# Patient Record
Sex: Male | Born: 1960 | Race: Black or African American | Hispanic: No | Marital: Single | Smoking: Current every day smoker
Health system: Southern US, Community
[De-identification: ages and names within clinical notes are randomized; demographics above are authoritative.]

## PROBLEM LIST (undated history)

## (undated) DIAGNOSIS — G629 Polyneuropathy, unspecified: Secondary | ICD-10-CM

## (undated) DIAGNOSIS — Z21 Asymptomatic human immunodeficiency virus [HIV] infection status: Secondary | ICD-10-CM

## (undated) DIAGNOSIS — B1910 Unspecified viral hepatitis B without hepatic coma: Secondary | ICD-10-CM

## (undated) DIAGNOSIS — E785 Hyperlipidemia, unspecified: Secondary | ICD-10-CM

## (undated) DIAGNOSIS — B009 Herpesviral infection, unspecified: Secondary | ICD-10-CM

## (undated) DIAGNOSIS — B20 Human immunodeficiency virus [HIV] disease: Secondary | ICD-10-CM

## (undated) DIAGNOSIS — J449 Chronic obstructive pulmonary disease, unspecified: Secondary | ICD-10-CM

## (undated) DIAGNOSIS — I10 Essential (primary) hypertension: Secondary | ICD-10-CM

## (undated) HISTORY — PX: EYE SURGERY: SHX253

## (undated) HISTORY — DX: Chronic obstructive pulmonary disease, unspecified: J44.9

## (undated) HISTORY — PX: HERNIA REPAIR: SHX51

---

## 1997-11-01 ENCOUNTER — Emergency Department (HOSPITAL_COMMUNITY): Admission: EM | Admit: 1997-11-01 | Discharge: 1997-11-01 | Payer: Self-pay | Admitting: Emergency Medicine

## 1998-07-05 ENCOUNTER — Encounter: Payer: Self-pay | Admitting: Internal Medicine

## 1999-05-08 ENCOUNTER — Encounter: Payer: Self-pay | Admitting: Emergency Medicine

## 1999-05-08 ENCOUNTER — Emergency Department (HOSPITAL_COMMUNITY): Admission: EM | Admit: 1999-05-08 | Discharge: 1999-05-08 | Payer: Self-pay

## 1999-07-25 ENCOUNTER — Encounter: Payer: Self-pay | Admitting: Emergency Medicine

## 1999-07-25 ENCOUNTER — Emergency Department (HOSPITAL_COMMUNITY): Admission: EM | Admit: 1999-07-25 | Discharge: 1999-07-25 | Payer: Self-pay | Admitting: Emergency Medicine

## 2006-05-27 ENCOUNTER — Ambulatory Visit: Payer: Self-pay | Admitting: Family Medicine

## 2006-06-27 ENCOUNTER — Ambulatory Visit: Payer: Self-pay | Admitting: Internal Medicine

## 2006-07-25 ENCOUNTER — Ambulatory Visit: Payer: Self-pay | Admitting: Internal Medicine

## 2006-08-01 ENCOUNTER — Encounter: Payer: Self-pay | Admitting: Internal Medicine

## 2006-08-01 LAB — CONVERTED CEMR LAB

## 2006-08-19 ENCOUNTER — Ambulatory Visit: Payer: Self-pay | Admitting: Internal Medicine

## 2006-08-26 DIAGNOSIS — G609 Hereditary and idiopathic neuropathy, unspecified: Secondary | ICD-10-CM | POA: Insufficient documentation

## 2006-08-26 DIAGNOSIS — F411 Generalized anxiety disorder: Secondary | ICD-10-CM | POA: Insufficient documentation

## 2006-08-26 DIAGNOSIS — E785 Hyperlipidemia, unspecified: Secondary | ICD-10-CM

## 2006-08-26 DIAGNOSIS — I10 Essential (primary) hypertension: Secondary | ICD-10-CM | POA: Insufficient documentation

## 2006-08-26 DIAGNOSIS — Z9189 Other specified personal risk factors, not elsewhere classified: Secondary | ICD-10-CM

## 2006-08-26 DIAGNOSIS — F319 Bipolar disorder, unspecified: Secondary | ICD-10-CM

## 2006-08-26 DIAGNOSIS — B20 Human immunodeficiency virus [HIV] disease: Secondary | ICD-10-CM

## 2006-11-28 ENCOUNTER — Ambulatory Visit: Payer: Self-pay | Admitting: Internal Medicine

## 2006-11-28 LAB — CONVERTED CEMR LAB
ALT: 32 units/L (ref 0–53)
AST: 22 units/L (ref 0–37)
Absolute CD4: 293 #/uL — ABNORMAL LOW (ref 381–1469)
Albumin: 3.6 g/dL (ref 3.5–5.2)
Alkaline Phosphatase: 58 units/L (ref 39–117)
BUN: 14 mg/dL (ref 6–23)
Basophils Absolute: 0 10*3/uL (ref 0.0–0.1)
Basophils Relative: 0 % (ref 0–1)
CD4 T Helper %: 16 % — ABNORMAL LOW (ref 32–62)
CO2: 26 meq/L (ref 19–32)
Calcium: 8.7 mg/dL (ref 8.4–10.5)
Chloride: 109 meq/L (ref 96–112)
Creatinine, Ser: 0.58 mg/dL (ref 0.40–1.50)
Eosinophils Absolute: 0 10*3/uL — ABNORMAL LOW (ref 0.2–0.7)
Eosinophils Relative: 1 % (ref 0–5)
Glucose, Bld: 99 mg/dL (ref 70–99)
HCT: 34.4 % — ABNORMAL LOW (ref 39.0–52.0)
HIV 1 RNA Quant: 3640 copies/mL — ABNORMAL HIGH (ref <50)
HIV-1 RNA Quant, Log: 3.56 — ABNORMAL HIGH (ref <1.70)
Hemoglobin: 11.2 g/dL — ABNORMAL LOW (ref 13.0–17.0)
Lymphocytes Relative: 39 % (ref 12–46)
Lymphs Abs: 1.8 10*3/uL (ref 0.7–4.0)
MCHC: 32.6 g/dL (ref 30.0–36.0)
MCV: 94 fL (ref 78.0–100.0)
Monocytes Absolute: 0.5 10*3/uL (ref 0.1–1.0)
Monocytes Relative: 11 % (ref 3–12)
Neutro Abs: 2.3 10*3/uL (ref 1.7–7.7)
Neutrophils Relative %: 49 % (ref 43–77)
Platelets: 204 10*3/uL (ref 150–400)
Potassium: 3.9 meq/L (ref 3.5–5.3)
RBC: 3.66 M/uL — ABNORMAL LOW (ref 4.22–5.81)
RDW: 14.7 % (ref 11.5–15.5)
Sodium: 140 meq/L (ref 135–145)
Total Bilirubin: 0.2 mg/dL — ABNORMAL LOW (ref 0.3–1.2)
Total Lymphocytes %: 39 % (ref 12–46)
Total Protein: 6.6 g/dL (ref 6.0–8.3)
Total lymphocyte count: 1833 cells/mcL (ref 700–3300)
WBC, lymph enumeration: 4.7 10*3/uL (ref 4.0–10.5)
WBC: 4.7 10*3/uL (ref 4.0–10.5)

## 2007-02-20 ENCOUNTER — Ambulatory Visit: Payer: Self-pay | Admitting: Internal Medicine

## 2007-02-20 LAB — CONVERTED CEMR LAB
ALT: 28 units/L (ref 0–53)
AST: 23 units/L (ref 0–37)
Absolute CD4: 179 #/uL — ABNORMAL LOW (ref 381–1469)
Albumin: 4.3 g/dL (ref 3.5–5.2)
Alkaline Phosphatase: 77 units/L (ref 39–117)
BUN: 17 mg/dL (ref 6–23)
Basophils Absolute: 0 10*3/uL (ref 0.0–0.1)
Basophils Relative: 0 % (ref 0–1)
CD4 T Helper %: 13 % — ABNORMAL LOW (ref 32–62)
CO2: 23 meq/L (ref 19–32)
Calcium: 9.1 mg/dL (ref 8.4–10.5)
Chloride: 101 meq/L (ref 96–112)
Creatinine, Ser: 0.75 mg/dL (ref 0.40–1.50)
Eosinophils Absolute: 0 10*3/uL (ref 0.0–0.7)
Eosinophils Relative: 0 % (ref 0–5)
Glucose, Bld: 75 mg/dL (ref 70–99)
HCT: 44.8 % (ref 39.0–52.0)
HIV 1 RNA Quant: 11600 copies/mL — ABNORMAL HIGH (ref <50)
HIV-1 RNA Quant, Log: 4.06 — ABNORMAL HIGH (ref <1.70)
Hemoglobin: 14.3 g/dL (ref 13.0–17.0)
Lymphocytes Relative: 26 % (ref 12–46)
Lymphs Abs: 1.4 10*3/uL (ref 0.7–4.0)
MCHC: 31.9 g/dL (ref 30.0–36.0)
MCV: 89.2 fL (ref 78.0–100.0)
Monocytes Absolute: 0.5 10*3/uL (ref 0.1–1.0)
Monocytes Relative: 9 % (ref 3–12)
Neutro Abs: 3.4 10*3/uL (ref 1.7–7.7)
Neutrophils Relative %: 65 % (ref 43–77)
Platelets: 187 10*3/uL (ref 150–400)
Potassium: 4.4 meq/L (ref 3.5–5.3)
RBC: 5.02 M/uL (ref 4.22–5.81)
RDW: 15.1 % (ref 11.5–15.5)
Sodium: 142 meq/L (ref 135–145)
Total Bilirubin: 0.5 mg/dL (ref 0.3–1.2)
Total Lymphocytes %: 26 % (ref 12–46)
Total Protein: 8.4 g/dL — ABNORMAL HIGH (ref 6.0–8.3)
Total lymphocyte count: 1378 cells/mcL (ref 700–3300)
WBC, lymph enumeration: 5.3 10*3/uL (ref 4.0–10.5)
WBC: 5.3 10*3/uL (ref 4.0–10.5)

## 2007-02-27 ENCOUNTER — Encounter: Payer: Self-pay | Admitting: Internal Medicine

## 2007-02-27 LAB — CONVERTED CEMR LAB

## 2007-05-26 ENCOUNTER — Ambulatory Visit: Payer: Self-pay | Admitting: Internal Medicine

## 2007-05-26 LAB — CONVERTED CEMR LAB
AST: 21 units/L (ref 0–37)
Absolute CD4: 168 #/uL — ABNORMAL LOW (ref 381–1469)
Albumin: 4 g/dL (ref 3.5–5.2)
Alkaline Phosphatase: 64 units/L (ref 39–117)
BUN: 14 mg/dL (ref 6–23)
Basophils Relative: 0 % (ref 0–1)
Creatinine, Ser: 0.75 mg/dL (ref 0.40–1.50)
Eosinophils Absolute: 0 10*3/uL (ref 0.0–0.7)
Eosinophils Relative: 0 % (ref 0–5)
Glucose, Bld: 79 mg/dL (ref 70–99)
HCT: 42.4 % (ref 39.0–52.0)
HDL: 50 mg/dL (ref >39)
HIV 1 RNA Quant: 16500 copies/mL — ABNORMAL HIGH (ref <50)
LDL Cholesterol: 120 mg/dL — ABNORMAL HIGH (ref 0–99)
Lymphs Abs: 1.5 10*3/uL (ref 0.7–4.0)
MCHC: 31.8 g/dL (ref 30.0–36.0)
MCV: 88.7 fL (ref 78.0–100.0)
Monocytes Absolute: 0.4 10*3/uL (ref 0.1–1.0)
Monocytes Relative: 9 % (ref 3–12)
Neutrophils Relative %: 56 % (ref 43–77)
Potassium: 3.7 meq/L (ref 3.5–5.3)
RBC: 4.78 M/uL (ref 4.22–5.81)
Total Bilirubin: 0.3 mg/dL (ref 0.3–1.2)
Total CHOL/HDL Ratio: 3.7
Total Lymphocytes %: 34 % (ref 12–46)
Triglycerides: 80 mg/dL (ref <150)
WBC: 4.5 10*3/uL (ref 4.0–10.5)

## 2007-08-21 ENCOUNTER — Ambulatory Visit: Payer: Self-pay | Admitting: Internal Medicine

## 2007-08-21 LAB — CONVERTED CEMR LAB
Albumin: 3.7 g/dL (ref 3.5–5.2)
BUN: 11 mg/dL (ref 6–23)
CO2: 23 meq/L (ref 19–32)
Eosinophils Relative: 0 % (ref 0–5)
Glucose, Bld: 78 mg/dL (ref 70–99)
HCT: 48.9 % (ref 39.0–52.0)
HIV 1 RNA Quant: 29500 copies/mL — ABNORMAL HIGH (ref <50)
HIV-1 RNA Quant, Log: 4.47 — ABNORMAL HIGH (ref <1.70)
Lymphocytes Relative: 26 % (ref 12–46)
Lymphs Abs: 1.1 10*3/uL (ref 0.7–4.0)
MCV: 88.4 fL (ref 78.0–100.0)
Monocytes Relative: 11 % (ref 3–12)
Neutrophils Relative %: 62 % (ref 43–77)
Platelets: 117 10*3/uL — ABNORMAL LOW (ref 150–400)
Potassium: 3.5 meq/L (ref 3.5–5.3)
RBC: 5.53 M/uL (ref 4.22–5.81)
Sodium: 142 meq/L (ref 135–145)
Total Protein: 7 g/dL (ref 6.0–8.3)
WBC: 4.3 10*3/uL (ref 4.0–10.5)

## 2007-09-04 ENCOUNTER — Ambulatory Visit: Payer: Self-pay | Admitting: Internal Medicine

## 2007-11-20 ENCOUNTER — Ambulatory Visit: Payer: Self-pay | Admitting: Internal Medicine

## 2007-11-20 LAB — CONVERTED CEMR LAB
Albumin: 2.1 g/dL — ABNORMAL LOW (ref 3.5–5.2)
Alkaline Phosphatase: 72 units/L (ref 39–117)
BUN: 17 mg/dL (ref 6–23)
CD4 T Helper %: 20 % — ABNORMAL LOW (ref 32–62)
Creatinine, Ser: 0.87 mg/dL (ref 0.40–1.50)
Eosinophils Absolute: 0 10*3/uL (ref 0.0–0.7)
Eosinophils Relative: 1 % (ref 0–5)
Glucose, Bld: 88 mg/dL (ref 70–99)
HCT: 42.9 % (ref 39.0–52.0)
HIV-1 RNA Quant, Log: 2.16 — ABNORMAL HIGH (ref <1.68)
Hemoglobin: 14.5 g/dL (ref 13.0–17.0)
Lymphs Abs: 1.3 10*3/uL (ref 0.7–4.0)
MCV: 87.9 fL (ref 78.0–100.0)
Monocytes Absolute: 0.6 10*3/uL (ref 0.1–1.0)
Monocytes Relative: 10 % (ref 3–12)
Potassium: 4.1 meq/L (ref 3.5–5.3)
RBC: 4.88 M/uL (ref 4.22–5.81)
WBC, lymph enumeration: 5.3 10*3/uL (ref 4.0–10.5)
WBC: 5.3 10*3/uL (ref 4.0–10.5)

## 2007-12-04 ENCOUNTER — Ambulatory Visit: Payer: Self-pay | Admitting: Internal Medicine

## 2007-12-04 ENCOUNTER — Encounter: Payer: Self-pay | Admitting: Internal Medicine

## 2008-02-23 ENCOUNTER — Encounter: Payer: Self-pay | Admitting: Internal Medicine

## 2008-02-23 ENCOUNTER — Ambulatory Visit: Payer: Self-pay | Admitting: Internal Medicine

## 2008-02-24 ENCOUNTER — Encounter: Payer: Self-pay | Admitting: Internal Medicine

## 2008-02-24 LAB — CONVERTED CEMR LAB
ALT: 31 units/L (ref 0–53)
AST: 57 units/L — ABNORMAL HIGH (ref 0–37)
Absolute CD4: 148 #/uL — ABNORMAL LOW (ref 381–1469)
Alkaline Phosphatase: 86 units/L (ref 39–117)
Basophils Absolute: 0 10*3/uL (ref 0.0–0.1)
Basophils Relative: 1 % (ref 0–1)
Creatinine, Ser: 0.53 mg/dL (ref 0.40–1.50)
Eosinophils Relative: 0 % (ref 0–5)
HCT: 42.2 % (ref 39.0–52.0)
Hemoglobin: 13.5 g/dL (ref 13.0–17.0)
MCHC: 32 g/dL (ref 30.0–36.0)
Monocytes Absolute: 0.3 10*3/uL (ref 0.1–1.0)
Neutro Abs: 1.9 10*3/uL (ref 1.7–7.7)
RDW: 13.9 % (ref 11.5–15.5)
Total Bilirubin: 0.3 mg/dL (ref 0.3–1.2)
Total Lymphocytes %: 28 % (ref 12–46)
Total lymphocyte count: 868 cells/mcL (ref 700–3300)

## 2008-02-25 ENCOUNTER — Telehealth: Payer: Self-pay | Admitting: Internal Medicine

## 2008-02-26 ENCOUNTER — Encounter: Payer: Self-pay | Admitting: Internal Medicine

## 2008-02-27 ENCOUNTER — Ambulatory Visit: Payer: Self-pay | Admitting: Internal Medicine

## 2008-03-04 LAB — CONVERTED CEMR LAB
Calcium: 7.9 mg/dL — ABNORMAL LOW (ref 8.4–10.5)
Sodium: 139 meq/L (ref 135–145)

## 2008-03-22 ENCOUNTER — Encounter: Payer: Self-pay | Admitting: Internal Medicine

## 2008-03-22 ENCOUNTER — Ambulatory Visit: Payer: Self-pay | Admitting: Internal Medicine

## 2008-03-22 LAB — CONVERTED CEMR LAB
ALT: 17 units/L (ref 0–53)
AST: 22 units/L (ref 0–37)
Absolute CD4: 115 #/uL — ABNORMAL LOW (ref 381–1469)
Albumin: 2.6 g/dL — ABNORMAL LOW (ref 3.5–5.2)
Alkaline Phosphatase: 70 units/L (ref 39–117)
Basophils Absolute: 0 10*3/uL (ref 0.0–0.1)
CD4 T Helper %: 9 % — ABNORMAL LOW (ref 32–62)
Glucose, Bld: 80 mg/dL (ref 70–99)
HCT: 38.4 % — ABNORMAL LOW (ref 39.0–52.0)
HIV-1 RNA Quant, Log: 4.94 — ABNORMAL HIGH (ref <1.68)
Hemoglobin: 12.4 g/dL — ABNORMAL LOW (ref 13.0–17.0)
Lymphocytes Relative: 25 % (ref 12–46)
Monocytes Absolute: 0.4 10*3/uL (ref 0.1–1.0)
Monocytes Relative: 8 % (ref 3–12)
Neutro Abs: 3.4 10*3/uL (ref 1.7–7.7)
Neutrophils Relative %: 66 % (ref 43–77)
Platelets: 289 10*3/uL (ref 150–400)
Potassium: 2.7 meq/L — CL (ref 3.5–5.3)
Sodium: 141 meq/L (ref 135–145)
Total Protein: 6.5 g/dL (ref 6.0–8.3)
WBC: 5.1 10*3/uL (ref 4.0–10.5)

## 2008-03-23 ENCOUNTER — Telehealth: Payer: Self-pay | Admitting: Internal Medicine

## 2008-04-05 ENCOUNTER — Telehealth: Payer: Self-pay | Admitting: Internal Medicine

## 2008-04-06 ENCOUNTER — Ambulatory Visit: Payer: Self-pay | Admitting: Internal Medicine

## 2008-04-06 ENCOUNTER — Encounter: Payer: Self-pay | Admitting: Internal Medicine

## 2008-04-06 ENCOUNTER — Encounter (INDEPENDENT_AMBULATORY_CARE_PROVIDER_SITE_OTHER): Payer: Self-pay | Admitting: Internal Medicine

## 2008-04-06 DIAGNOSIS — L03119 Cellulitis of unspecified part of limb: Secondary | ICD-10-CM

## 2008-04-06 DIAGNOSIS — L02619 Cutaneous abscess of unspecified foot: Secondary | ICD-10-CM

## 2008-04-06 DIAGNOSIS — L03019 Cellulitis of unspecified finger: Secondary | ICD-10-CM

## 2008-04-06 DIAGNOSIS — L02519 Cutaneous abscess of unspecified hand: Secondary | ICD-10-CM

## 2008-04-22 ENCOUNTER — Telehealth (INDEPENDENT_AMBULATORY_CARE_PROVIDER_SITE_OTHER): Payer: Self-pay | Admitting: *Deleted

## 2008-05-28 ENCOUNTER — Telehealth: Payer: Self-pay | Admitting: Internal Medicine

## 2008-06-03 ENCOUNTER — Encounter: Payer: Self-pay | Admitting: Internal Medicine

## 2008-06-03 ENCOUNTER — Ambulatory Visit: Payer: Self-pay | Admitting: Internal Medicine

## 2008-07-14 ENCOUNTER — Telehealth: Payer: Self-pay | Admitting: Internal Medicine

## 2008-07-20 ENCOUNTER — Ambulatory Visit: Payer: Self-pay | Admitting: Internal Medicine

## 2008-07-20 ENCOUNTER — Encounter: Payer: Self-pay | Admitting: Internal Medicine

## 2008-07-20 LAB — CONVERTED CEMR LAB
ALT: 13 units/L (ref 0–53)
Absolute CD4: 200 #/uL — ABNORMAL LOW (ref 381–1469)
Albumin: 1.7 g/dL — ABNORMAL LOW (ref 3.5–5.2)
CO2: 25 meq/L (ref 19–32)
Glucose, Bld: 104 mg/dL — ABNORMAL HIGH (ref 70–99)
HIV 1 RNA Quant: 1900 copies/mL — ABNORMAL HIGH (ref <48)
HIV-1 RNA Quant, Log: 3.28 — ABNORMAL HIGH (ref <1.68)
Hemoglobin: 13.8 g/dL (ref 13.0–17.0)
Lymphocytes Relative: 26 % (ref 12–46)
Lymphs Abs: 1.7 10*3/uL (ref 0.7–4.0)
Monocytes Absolute: 0.7 10*3/uL (ref 0.1–1.0)
Monocytes Relative: 11 % (ref 3–12)
Neutro Abs: 3.9 10*3/uL (ref 1.7–7.7)
Neutrophils Relative %: 60 % (ref 43–77)
Potassium: 3.9 meq/L (ref 3.5–5.3)
RBC: 4.85 M/uL (ref 4.22–5.81)
Sodium: 143 meq/L (ref 135–145)
Total Protein: 5.4 g/dL — ABNORMAL LOW (ref 6.0–8.3)
WBC: 6.4 10*3/uL (ref 4.0–10.5)

## 2008-07-29 ENCOUNTER — Encounter: Payer: Self-pay | Admitting: Internal Medicine

## 2008-07-29 ENCOUNTER — Ambulatory Visit: Payer: Self-pay | Admitting: Internal Medicine

## 2008-07-29 DIAGNOSIS — R609 Edema, unspecified: Secondary | ICD-10-CM

## 2008-08-04 ENCOUNTER — Encounter: Payer: Self-pay | Admitting: Internal Medicine

## 2008-08-05 ENCOUNTER — Ambulatory Visit: Payer: Self-pay | Admitting: Vascular Surgery

## 2008-08-05 ENCOUNTER — Ambulatory Visit (HOSPITAL_COMMUNITY): Admission: RE | Admit: 2008-08-05 | Discharge: 2008-08-05 | Payer: Self-pay | Admitting: Internal Medicine

## 2008-08-05 ENCOUNTER — Encounter: Payer: Self-pay | Admitting: Internal Medicine

## 2008-08-05 LAB — CONVERTED CEMR LAB
Collection Interval-CRCL: 24 hr
Creatinine 24 HR UR: 1570 mg/24hr (ref 800–2000)
Creatinine Clearance: 218 mL/min — ABNORMAL HIGH (ref 75–125)
Protein, Ur: 2873 mg/24hr — ABNORMAL HIGH (ref 50–100)

## 2008-08-19 ENCOUNTER — Encounter: Payer: Self-pay | Admitting: Internal Medicine

## 2008-08-19 ENCOUNTER — Ambulatory Visit: Payer: Self-pay | Admitting: Internal Medicine

## 2008-08-23 ENCOUNTER — Ambulatory Visit (HOSPITAL_COMMUNITY): Admission: RE | Admit: 2008-08-23 | Discharge: 2008-08-23 | Payer: Self-pay | Admitting: Internal Medicine

## 2008-08-26 ENCOUNTER — Telehealth: Payer: Self-pay | Admitting: Internal Medicine

## 2008-09-07 ENCOUNTER — Encounter: Payer: Self-pay | Admitting: Internal Medicine

## 2008-09-07 ENCOUNTER — Ambulatory Visit (HOSPITAL_COMMUNITY): Admission: RE | Admit: 2008-09-07 | Discharge: 2008-09-07 | Payer: Self-pay | Admitting: Internal Medicine

## 2008-09-30 ENCOUNTER — Telehealth: Payer: Self-pay | Admitting: Internal Medicine

## 2008-10-05 ENCOUNTER — Encounter: Payer: Self-pay | Admitting: Internal Medicine

## 2008-10-06 ENCOUNTER — Encounter: Payer: Self-pay | Admitting: Internal Medicine

## 2008-10-21 ENCOUNTER — Encounter: Payer: Self-pay | Admitting: Internal Medicine

## 2008-11-08 ENCOUNTER — Telehealth: Payer: Self-pay | Admitting: Internal Medicine

## 2008-12-30 ENCOUNTER — Encounter: Payer: Self-pay | Admitting: Internal Medicine

## 2009-01-13 ENCOUNTER — Telehealth: Payer: Self-pay | Admitting: Internal Medicine

## 2010-04-24 ENCOUNTER — Encounter: Payer: Self-pay | Admitting: Infectious Diseases

## 2010-05-05 ENCOUNTER — Ambulatory Visit: Payer: Self-pay | Admitting: Adult Health

## 2010-10-03 ENCOUNTER — Ambulatory Visit: Payer: Self-pay

## 2010-12-01 NOTE — Progress Notes (Signed)
°  This encounter was created in error - please disregard.  Encounter edited with Jennet Maduro. MBP

## 2011-05-15 ENCOUNTER — Ambulatory Visit: Payer: Medicare Other

## 2011-05-15 ENCOUNTER — Other Ambulatory Visit (HOSPITAL_COMMUNITY)
Admission: RE | Admit: 2011-05-15 | Discharge: 2011-05-15 | Disposition: A | Discharge status: Home / Self Care | Payer: Medicare Other | Source: Ambulatory Visit | Attending: Infectious Diseases | Admitting: Infectious Diseases

## 2011-05-15 DIAGNOSIS — B20 Human immunodeficiency virus [HIV] disease: Secondary | ICD-10-CM

## 2011-05-15 DIAGNOSIS — G4700 Insomnia, unspecified: Secondary | ICD-10-CM | POA: Insufficient documentation

## 2011-05-15 DIAGNOSIS — R748 Abnormal levels of other serum enzymes: Secondary | ICD-10-CM

## 2011-05-15 DIAGNOSIS — F319 Bipolar disorder, unspecified: Secondary | ICD-10-CM | POA: Insufficient documentation

## 2011-05-15 DIAGNOSIS — B1910 Unspecified viral hepatitis B without hepatic coma: Secondary | ICD-10-CM

## 2011-05-15 DIAGNOSIS — Z113 Encounter for screening for infections with a predominantly sexual mode of transmission: Secondary | ICD-10-CM | POA: Insufficient documentation

## 2011-05-15 DIAGNOSIS — G8929 Other chronic pain: Secondary | ICD-10-CM

## 2011-05-15 DIAGNOSIS — I10 Essential (primary) hypertension: Secondary | ICD-10-CM

## 2011-05-15 DIAGNOSIS — Z8619 Personal history of other infectious and parasitic diseases: Secondary | ICD-10-CM | POA: Insufficient documentation

## 2011-05-15 LAB — COMPLETE METABOLIC PANEL WITH GFR
ALT: 13 U/L (ref 0–53)
AST: 16 U/L (ref 0–37)
Albumin: 3.9 g/dL (ref 3.5–5.2)
CO2: 25 mEq/L (ref 19–32)
Calcium: 9.2 mg/dL (ref 8.4–10.5)
Chloride: 104 mEq/L (ref 96–112)
Creat: 0.64 mg/dL (ref 0.50–1.35)
GFR, Est African American: >89 mL/min
Potassium: 3.9 mEq/L (ref 3.5–5.3)

## 2011-05-15 LAB — URINALYSIS
Bilirubin Urine: NEGATIVE
Glucose, UA: NEGATIVE mg/dL
Hgb urine dipstick: NEGATIVE
Ketones, ur: NEGATIVE mg/dL
Leukocytes, UA: NEGATIVE
Nitrite: NEGATIVE
Protein, ur: >300 mg/dL — AB
Specific Gravity, Urine: 1.025 (ref 1.005–1.030)
Urobilinogen, UA: 1 mg/dL (ref 0.0–1.0)
pH: 6 (ref 5.0–8.0)

## 2011-05-15 LAB — CBC WITH DIFFERENTIAL/PLATELET
Eosinophils Relative: 1 % (ref 0–5)
HCT: 43.4 % (ref 39.0–52.0)
Lymphocytes Relative: 28 % (ref 12–46)
Lymphs Abs: 1.6 10*3/uL (ref 0.7–4.0)
MCV: 84.9 fL (ref 78.0–100.0)
Monocytes Absolute: 0.5 10*3/uL (ref 0.1–1.0)
Neutro Abs: 3.5 10*3/uL (ref 1.7–7.7)
Platelets: 246 10*3/uL (ref 150–400)
RBC: 5.11 MIL/uL (ref 4.22–5.81)
WBC: 5.6 10*3/uL (ref 4.0–10.5)

## 2011-05-15 LAB — HEPATITIS C ANTIBODY: HCV Ab: NEGATIVE

## 2011-05-15 LAB — LIPID PANEL
Cholesterol: 271 mg/dL — ABNORMAL HIGH (ref 0–200)
HDL: 50 mg/dL
LDL Cholesterol: 204 mg/dL — ABNORMAL HIGH (ref 0–99)
Total CHOL/HDL Ratio: 5.4 ratio
Triglycerides: 85 mg/dL
VLDL: 17 mg/dL (ref 0–40)

## 2011-05-15 LAB — HEPATITIS B SURFACE ANTIGEN: Hepatitis B Surface Ag: NEGATIVE

## 2011-05-16 LAB — T-HELPER CELL (CD4) - (RCID CLINIC ONLY): CD4 % Helper T Cell: 29 % — ABNORMAL LOW (ref 33–55)

## 2011-05-16 LAB — HEPATITIS A ANTIBODY, TOTAL: Hep A Total Ab: NEGATIVE

## 2011-05-16 NOTE — Progress Notes (Signed)
Medical records received from [facility] dates of treatment 1998-2000. No records from [facility].  Pt states he is adherent with his ART.   Pt [facility]ed from prison on 04-05-11.  Laurell Josephs, RN IV, BSN

## 2011-05-17 LAB — HIV-1 RNA ULTRAQUANT REFLEX TO GENTYP+: HIV 1 RNA Quant: <20 copies/mL (ref <20)

## 2011-05-18 ENCOUNTER — Other Ambulatory Visit: Payer: Self-pay | Admitting: Infectious Diseases

## 2011-05-18 ENCOUNTER — Telehealth: Payer: Self-pay | Admitting: *Deleted

## 2011-05-18 NOTE — Telephone Encounter (Signed)
States the skin remained flat, not red or hard or elevated from skin.

## 2011-05-29 ENCOUNTER — Ambulatory Visit: Payer: Medicare Other | Admitting: Internal Medicine

## 2011-06-27 ENCOUNTER — Telehealth: Payer: Self-pay | Admitting: *Deleted

## 2011-06-27 NOTE — Telephone Encounter (Signed)
Called patient to remind him of appt tomorrow and his phone is not accepting calls. Evan Bentley

## 2011-06-28 ENCOUNTER — Telehealth: Payer: Self-pay | Admitting: *Deleted

## 2011-06-28 ENCOUNTER — Ambulatory Visit: Payer: Medicare Other | Admitting: Internal Medicine

## 2011-06-28 NOTE — Telephone Encounter (Signed)
Message left sharing that he should call for a new appt and that if he needs to cancel or reschedule to please call [facility] to free up his appt time.  Will make [facility] referral to reach out to the pt.

## 2012-01-24 ENCOUNTER — Encounter (HOSPITAL_COMMUNITY): Payer: Self-pay | Admitting: Emergency Medicine

## 2012-01-24 ENCOUNTER — Emergency Department (HOSPITAL_COMMUNITY): Payer: Medicare Other

## 2012-01-24 ENCOUNTER — Emergency Department (HOSPITAL_COMMUNITY)
Admission: EM | Admit: 2012-01-24 | Discharge: 2012-01-24 | Disposition: A | Discharge status: Home / Self Care | Payer: Medicare Other | Attending: Emergency Medicine | Admitting: Emergency Medicine

## 2012-01-24 DIAGNOSIS — Z79899 Other long term (current) drug therapy: Secondary | ICD-10-CM | POA: Insufficient documentation

## 2012-01-24 DIAGNOSIS — F17200 Nicotine dependence, unspecified, uncomplicated: Secondary | ICD-10-CM | POA: Insufficient documentation

## 2012-01-24 DIAGNOSIS — Z8669 Personal history of other diseases of the nervous system and sense organs: Secondary | ICD-10-CM | POA: Insufficient documentation

## 2012-01-24 DIAGNOSIS — R05 Cough: Secondary | ICD-10-CM | POA: Insufficient documentation

## 2012-01-24 DIAGNOSIS — E785 Hyperlipidemia, unspecified: Secondary | ICD-10-CM | POA: Insufficient documentation

## 2012-01-24 DIAGNOSIS — I10 Essential (primary) hypertension: Secondary | ICD-10-CM | POA: Insufficient documentation

## 2012-01-24 DIAGNOSIS — S40029A Contusion of unspecified upper arm, initial encounter: Secondary | ICD-10-CM | POA: Insufficient documentation

## 2012-01-24 DIAGNOSIS — S41109A Unspecified open wound of unspecified upper arm, initial encounter: Secondary | ICD-10-CM | POA: Insufficient documentation

## 2012-01-24 DIAGNOSIS — Z8619 Personal history of other infectious and parasitic diseases: Secondary | ICD-10-CM | POA: Insufficient documentation

## 2012-01-24 DIAGNOSIS — IMO0002 Reserved for concepts with insufficient information to code with codable children: Secondary | ICD-10-CM

## 2012-01-24 DIAGNOSIS — Z21 Asymptomatic human immunodeficiency virus [HIV] infection status: Secondary | ICD-10-CM | POA: Insufficient documentation

## 2012-01-24 DIAGNOSIS — M549 Dorsalgia, unspecified: Secondary | ICD-10-CM | POA: Insufficient documentation

## 2012-01-24 DIAGNOSIS — Z8719 Personal history of other diseases of the digestive system: Secondary | ICD-10-CM | POA: Insufficient documentation

## 2012-01-24 DIAGNOSIS — R209 Unspecified disturbances of skin sensation: Secondary | ICD-10-CM | POA: Insufficient documentation

## 2012-01-24 DIAGNOSIS — J4489 Other specified chronic obstructive pulmonary disease: Secondary | ICD-10-CM | POA: Insufficient documentation

## 2012-01-24 DIAGNOSIS — S52209A Unspecified fracture of shaft of unspecified ulna, initial encounter for closed fracture: Secondary | ICD-10-CM | POA: Insufficient documentation

## 2012-01-24 DIAGNOSIS — T148XXA Other injury of unspecified body region, initial encounter: Secondary | ICD-10-CM

## 2012-01-24 DIAGNOSIS — R059 Cough, unspecified: Secondary | ICD-10-CM | POA: Insufficient documentation

## 2012-01-24 DIAGNOSIS — J449 Chronic obstructive pulmonary disease, unspecified: Secondary | ICD-10-CM | POA: Insufficient documentation

## 2012-01-24 HISTORY — DX: Hyperlipidemia, unspecified: E78.5

## 2012-01-24 HISTORY — DX: Unspecified viral hepatitis B without hepatic coma: B19.10

## 2012-01-24 HISTORY — DX: Essential (primary) hypertension: I10

## 2012-01-24 HISTORY — DX: Asymptomatic human immunodeficiency virus (hiv) infection status: Z21

## 2012-01-24 HISTORY — DX: Polyneuropathy, unspecified: G62.9

## 2012-01-24 HISTORY — DX: Human immunodeficiency virus (HIV) disease: B20

## 2012-01-24 HISTORY — DX: Herpesviral infection, unspecified: B00.9

## 2012-01-24 LAB — CBC
HCT: 43.6 % (ref 39.0–52.0)
Hemoglobin: 14.5 g/dL (ref 13.0–17.0)
RBC: 5.15 MIL/uL (ref 4.22–5.81)
RDW: 13.7 % (ref 11.5–15.5)
WBC: 9.3 10*3/uL (ref 4.0–10.5)

## 2012-01-24 MED ORDER — TETANUS-DIPHTHERIA TOXOIDS TD 5-2 LFU IM INJ
0.5000 mL | INJECTION | Freq: Once | INTRAMUSCULAR | Status: AC
  Administered 2012-01-24: 0.5 mL via INTRAMUSCULAR
  Filled 2012-01-24: qty 0.5

## 2012-01-24 MED ORDER — OXYCODONE-ACETAMINOPHEN 5-325 MG PO TABS
2.0000 | ORAL_TABLET | Freq: Once | ORAL | Status: AC
  Administered 2012-01-24: 2 via ORAL
  Filled 2012-01-24: qty 2

## 2012-01-24 MED ORDER — ACETAMINOPHEN-CODEINE 120-12 MG/5ML PO SUSP
5.0000 mL | Freq: Four times a day (QID) | ORAL | Status: DC | PRN
Start: 2012-01-24 — End: 2012-01-24

## 2012-01-24 MED ORDER — OXYCODONE-ACETAMINOPHEN 5-325 MG PO TABS: 1.0000 | ORAL_TABLET | Freq: Four times a day (QID) | ORAL | Status: AC | PRN

## 2012-01-24 NOTE — ED Notes (Signed)
Spoke with ortho tech.  He will be here shortly to do splint.

## 2012-01-24 NOTE — ED Provider Notes (Signed)
Pt had a bleeding occipital scalp wound 2.5 cm.  I put 6 staples to close the wound  Benny Lennert, MD 01/24/12 2008

## 2012-01-24 NOTE — Progress Notes (Signed)
Orthopedic Tech Progress Note Patient Details:  Evan Bentley 12-20-1960 [id number]  Ortho Devices Type of Ortho Device: Sugartong splint Ortho Device/Splint Location: right arm Ortho Device/Splint Interventions: Application   [name] 01/24/2012, 10:09 PM
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
## 2012-01-24 NOTE — ED Notes (Signed)
Pt transported to xray. nad

## 2012-01-24 NOTE — ED Notes (Signed)
Pt dc to home.  Pt states understanding to dc paperwork and will f/u with ortho this week.  Pt transported by ptar with stretcher.  Pt states will be able to take care of self once he gets home.

## 2012-01-24 NOTE — ED Notes (Signed)
[facility] called to pick up PT. And return them to home.

## 2012-01-30 NOTE — ED Provider Notes (Signed)
Medical screening examination/treatment/procedure(s) were performed by non-physician practitioner and as supervising physician I was immediately available for consultation/collaboration.  Tobin Chad, MD 01/30/12 [phone]

## 2012-04-23 ENCOUNTER — Encounter: Payer: Self-pay | Admitting: *Deleted
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
## 2013-09-08 ENCOUNTER — Ambulatory Visit (HOSPITAL_COMMUNITY)
Admission: RE | Admit: 2013-09-08 | Discharge: 2013-09-08 | Disposition: A | Discharge status: Home / Self Care | Payer: Medicare Other | Source: Ambulatory Visit | Attending: Cardiology | Admitting: Cardiology

## 2013-09-08 ENCOUNTER — Encounter (HOSPITAL_COMMUNITY)
Admission: RE | Disposition: A | Discharge status: Home / Self Care | Payer: Self-pay | Source: Ambulatory Visit | Attending: Cardiology

## 2013-09-08 DIAGNOSIS — Z538 Procedure and treatment not carried out for other reasons: Secondary | ICD-10-CM | POA: Diagnosis not present

## 2013-09-08 DIAGNOSIS — I10 Essential (primary) hypertension: Secondary | ICD-10-CM | POA: Insufficient documentation

## 2013-09-08 DIAGNOSIS — B20 Human immunodeficiency virus [HIV] disease: Secondary | ICD-10-CM | POA: Diagnosis not present

## 2013-09-08 DIAGNOSIS — G8929 Other chronic pain: Secondary | ICD-10-CM | POA: Diagnosis not present

## 2013-09-08 DIAGNOSIS — R0602 Shortness of breath: Secondary | ICD-10-CM | POA: Diagnosis present

## 2013-09-08 DIAGNOSIS — F17200 Nicotine dependence, unspecified, uncomplicated: Secondary | ICD-10-CM | POA: Diagnosis not present

## 2013-09-08 DIAGNOSIS — F411 Generalized anxiety disorder: Secondary | ICD-10-CM | POA: Diagnosis not present

## 2013-09-08 DIAGNOSIS — E785 Hyperlipidemia, unspecified: Secondary | ICD-10-CM | POA: Insufficient documentation

## 2013-09-08 DIAGNOSIS — I428 Other cardiomyopathies: Secondary | ICD-10-CM | POA: Diagnosis not present

## 2013-09-08 SURGERY — LEFT HEART CATHETERIZATION WITH CORONARY ANGIOGRAM
Anesthesia: LOCAL

## 2013-09-08 MED ORDER — SODIUM CHLORIDE 0.9 % IV SOLN: 250.0000 mL | INTRAVENOUS | Status: DC | PRN

## 2013-09-08 MED ORDER — SODIUM CHLORIDE 0.9 % IJ SOLN: 3.0000 mL | Freq: Two times a day (BID) | INTRAMUSCULAR | Status: DC

## 2013-09-08 MED ORDER — SODIUM CHLORIDE 0.9 % IJ SOLN: 3.0000 mL | INTRAMUSCULAR | Status: DC | PRN

## 2013-09-08 MED ORDER — ASPIRIN 81 MG PO CHEW
CHEWABLE_TABLET | ORAL | Status: AC
  Filled 2013-09-08: qty 1

## 2013-09-08 MED ORDER — ASPIRIN 81 MG PO CHEW
81.0000 mg | CHEWABLE_TABLET | ORAL | Status: AC
  Administered 2013-09-08: 81 mg via ORAL

## 2013-09-08 MED ORDER — SODIUM CHLORIDE 0.9 % IV SOLN
INTRAVENOUS | Status: DC
  Administered 2013-09-08: 10:00:00 via INTRAVENOUS

## 2013-10-19 ENCOUNTER — Ambulatory Visit (HOSPITAL_COMMUNITY): Admit: 2013-10-19 | Payer: Self-pay | Admitting: Interventional Cardiology

## 2013-10-19 ENCOUNTER — Inpatient Hospital Stay (HOSPITAL_COMMUNITY): Payer: Medicare Other

## 2013-10-19 ENCOUNTER — Inpatient Hospital Stay (HOSPITAL_COMMUNITY)
Admission: EM | Admit: 2013-10-19 | Discharge: 2013-11-15 | DRG: 250 | Disposition: E | Payer: Medicare Other | Attending: Pulmonary Disease | Admitting: Pulmonary Disease

## 2013-10-19 ENCOUNTER — Encounter (HOSPITAL_COMMUNITY): Payer: Self-pay | Admitting: Emergency Medicine

## 2013-10-19 ENCOUNTER — Emergency Department (HOSPITAL_COMMUNITY): Payer: Medicare Other

## 2013-10-19 ENCOUNTER — Encounter (HOSPITAL_COMMUNITY): Admission: EM | Disposition: E | Payer: Medicare Other | Source: Home / Self Care | Attending: Pulmonary Disease

## 2013-10-19 DIAGNOSIS — Z79891 Long term (current) use of opiate analgesic: Secondary | ICD-10-CM

## 2013-10-19 DIAGNOSIS — I469 Cardiac arrest, cause unspecified: Secondary | ICD-10-CM | POA: Diagnosis present

## 2013-10-19 DIAGNOSIS — J449 Chronic obstructive pulmonary disease, unspecified: Secondary | ICD-10-CM | POA: Diagnosis present

## 2013-10-19 DIAGNOSIS — I4891 Unspecified atrial fibrillation: Secondary | ICD-10-CM | POA: Diagnosis not present

## 2013-10-19 DIAGNOSIS — F17210 Nicotine dependence, cigarettes, uncomplicated: Secondary | ICD-10-CM | POA: Diagnosis present

## 2013-10-19 DIAGNOSIS — Z21 Asymptomatic human immunodeficiency virus [HIV] infection status: Secondary | ICD-10-CM | POA: Diagnosis present

## 2013-10-19 DIAGNOSIS — I2510 Atherosclerotic heart disease of native coronary artery without angina pectoris: Principal | ICD-10-CM | POA: Diagnosis present

## 2013-10-19 DIAGNOSIS — R570 Cardiogenic shock: Secondary | ICD-10-CM | POA: Diagnosis not present

## 2013-10-19 DIAGNOSIS — J690 Pneumonitis due to inhalation of food and vomit: Secondary | ICD-10-CM | POA: Diagnosis present

## 2013-10-19 DIAGNOSIS — G931 Anoxic brain damage, not elsewhere classified: Secondary | ICD-10-CM | POA: Diagnosis present

## 2013-10-19 DIAGNOSIS — E872 Acidosis: Secondary | ICD-10-CM | POA: Diagnosis present

## 2013-10-19 DIAGNOSIS — I472 Ventricular tachycardia: Secondary | ICD-10-CM | POA: Diagnosis present

## 2013-10-19 DIAGNOSIS — J9600 Acute respiratory failure, unspecified whether with hypoxia or hypercapnia: Secondary | ICD-10-CM | POA: Diagnosis present

## 2013-10-19 DIAGNOSIS — B1910 Unspecified viral hepatitis B without hepatic coma: Secondary | ICD-10-CM | POA: Diagnosis present

## 2013-10-19 DIAGNOSIS — D696 Thrombocytopenia, unspecified: Secondary | ICD-10-CM | POA: Diagnosis present

## 2013-10-19 DIAGNOSIS — I213 ST elevation (STEMI) myocardial infarction of unspecified site: Secondary | ICD-10-CM

## 2013-10-19 DIAGNOSIS — A419 Sepsis, unspecified organism: Secondary | ICD-10-CM | POA: Diagnosis present

## 2013-10-19 DIAGNOSIS — R6521 Severe sepsis with septic shock: Secondary | ICD-10-CM | POA: Diagnosis present

## 2013-10-19 DIAGNOSIS — E785 Hyperlipidemia, unspecified: Secondary | ICD-10-CM | POA: Diagnosis present

## 2013-10-19 DIAGNOSIS — I2119 ST elevation (STEMI) myocardial infarction involving other coronary artery of inferior wall: Secondary | ICD-10-CM | POA: Diagnosis present

## 2013-10-19 DIAGNOSIS — E876 Hypokalemia: Secondary | ICD-10-CM | POA: Diagnosis present

## 2013-10-19 DIAGNOSIS — Z79899 Other long term (current) drug therapy: Secondary | ICD-10-CM | POA: Diagnosis not present

## 2013-10-19 DIAGNOSIS — I10 Essential (primary) hypertension: Secondary | ICD-10-CM | POA: Diagnosis present

## 2013-10-19 DIAGNOSIS — J9601 Acute respiratory failure with hypoxia: Secondary | ICD-10-CM

## 2013-10-19 DIAGNOSIS — G629 Polyneuropathy, unspecified: Secondary | ICD-10-CM | POA: Diagnosis present

## 2013-10-19 DIAGNOSIS — R079 Chest pain, unspecified: Secondary | ICD-10-CM | POA: Diagnosis present

## 2013-10-19 HISTORY — PX: LEFT HEART CATHETERIZATION WITH CORONARY ANGIOGRAM: SHX5451

## 2013-10-19 HISTORY — PX: CARDIAC CATHETERIZATION: SHX172

## 2013-10-19 LAB — POCT I-STAT 3, ART BLOOD GAS (G3+)
ACID-BASE DEFICIT: 12 mmol/L — AB (ref 0.0–2.0)
Acid-base deficit: 8 mmol/L — ABNORMAL HIGH (ref 0.0–2.0)
BICARBONATE: 16 meq/L — AB (ref 20.0–24.0)
Bicarbonate: 21 mEq/L (ref 20.0–24.0)
O2 Saturation: 99 %
O2 Saturation: 85 %
PO2 ART: 46 mmHg — AB (ref 80.0–100.0)
Patient temperature: 31.8
TCO2: 23 mmol/L (ref 0–100)
TCO2: 17 mmol/L (ref 0–100)
pCO2 arterial: 54.7 mmHg — ABNORMAL HIGH (ref 35.0–45.0)
pCO2 arterial: 37.9 mmHg (ref 35.0–45.0)
pH, Arterial: 7.174 — CL (ref 7.350–7.450)
pH, Arterial: 7.203 — ABNORMAL LOW (ref 7.350–7.450)
pO2, Arterial: 177 mmHg — ABNORMAL HIGH (ref 80.0–100.0)

## 2013-10-19 LAB — URINALYSIS, ROUTINE W REFLEX MICROSCOPIC
Bilirubin Urine: NEGATIVE
Glucose, UA: NEGATIVE mg/dL
KETONES UR: NEGATIVE mg/dL
Leukocytes, UA: NEGATIVE
Nitrite: NEGATIVE
Protein, ur: 30 mg/dL — AB
SPECIFIC GRAVITY, URINE: 1.023 (ref 1.005–1.030)
Urobilinogen, UA: 1 mg/dL (ref 0.0–1.0)
pH: 6.5 (ref 5.0–8.0)

## 2013-10-19 LAB — CBC
HCT: 37.4 % — ABNORMAL LOW (ref 39.0–52.0)
HCT: 28 % — ABNORMAL LOW (ref 39.0–52.0)
Hemoglobin: 11.5 g/dL — ABNORMAL LOW (ref 13.0–17.0)
Hemoglobin: 9.3 g/dL — ABNORMAL LOW (ref 13.0–17.0)
MCH: 28.3 pg (ref 26.0–34.0)
MCH: 29.1 pg (ref 26.0–34.0)
MCHC: 30.7 g/dL (ref 30.0–36.0)
MCHC: 33.2 g/dL (ref 30.0–36.0)
MCV: 92.1 fL (ref 78.0–100.0)
MCV: 87.5 fL (ref 78.0–100.0)
Platelets: 122 10*3/uL — ABNORMAL LOW (ref 150–400)
Platelets: 148 10*3/uL — ABNORMAL LOW (ref 150–400)
RBC: 4.06 MIL/uL — ABNORMAL LOW (ref 4.22–5.81)
RBC: 3.2 MIL/uL — ABNORMAL LOW (ref 4.22–5.81)
RDW: 14.2 % (ref 11.5–15.5)
RDW: 14 % (ref 11.5–15.5)
WBC: 10 10*3/uL (ref 4.0–10.5)
WBC: 10.5 10*3/uL (ref 4.0–10.5)

## 2013-10-19 LAB — DIFFERENTIAL
Basophils Absolute: 0 10*3/uL (ref 0.0–0.1)
Basophils Relative: 0 % (ref 0–1)
Eosinophils Absolute: 0 10*3/uL (ref 0.0–0.7)
Eosinophils Relative: 0 % (ref 0–5)
Lymphocytes Relative: 30 % (ref 12–46)
Lymphs Abs: 3 10*3/uL (ref 0.7–4.0)
Monocytes Absolute: 0.9 10*3/uL (ref 0.1–1.0)
Monocytes Relative: 9 % (ref 3–12)
Neutro Abs: 6 10*3/uL (ref 1.7–7.7)
Neutrophils Relative %: 61 % (ref 43–77)

## 2013-10-19 LAB — BASIC METABOLIC PANEL
Anion gap: 23 — ABNORMAL HIGH (ref 5–15)
Anion gap: 10 (ref 5–15)
Anion gap: 12 (ref 5–15)
BUN: 15 mg/dL (ref 6–23)
BUN: 18 mg/dL (ref 6–23)
BUN: 17 mg/dL (ref 6–23)
CALCIUM: 6.5 mg/dL — AB (ref 8.4–10.5)
CHLORIDE: 112 meq/L (ref 96–112)
CO2: 15 mEq/L — ABNORMAL LOW (ref 19–32)
CO2: 21 mEq/L (ref 19–32)
CO2: 17 meq/L — AB (ref 19–32)
CREATININE: 0.88 mg/dL (ref 0.50–1.35)
Calcium: 8.7 mg/dL (ref 8.4–10.5)
Calcium: 5.5 mg/dL — CL (ref 8.4–10.5)
Chloride: 103 mEq/L (ref 96–112)
Chloride: 113 mEq/L — ABNORMAL HIGH (ref 96–112)
Creatinine, Ser: 0.85 mg/dL (ref 0.50–1.35)
Creatinine, Ser: 0.75 mg/dL (ref 0.50–1.35)
GFR calc Af Amer: >90 mL/min (ref >90)
GFR calc Af Amer: >90 mL/min (ref >90)
GFR calc non Af Amer: >90 mL/min (ref >90)
GFR calc non Af Amer: >90 mL/min (ref >90)
GFR calc non Af Amer: >90 mL/min (ref >90)
GLUCOSE: 239 mg/dL — AB (ref 70–99)
Glucose, Bld: 216 mg/dL — ABNORMAL HIGH (ref 70–99)
Glucose, Bld: 160 mg/dL — ABNORMAL HIGH (ref 70–99)
POTASSIUM: 4 meq/L (ref 3.7–5.3)
Potassium: 3.3 mEq/L — ABNORMAL LOW (ref 3.7–5.3)
Potassium: 5.5 mEq/L — ABNORMAL HIGH (ref 3.7–5.3)
Sodium: 141 mEq/L (ref 137–147)
Sodium: 144 mEq/L (ref 137–147)
Sodium: 141 mEq/L (ref 137–147)

## 2013-10-19 LAB — PROTIME-INR
INR: 1.32 (ref 0.00–1.49)
Prothrombin Time: 16.4 seconds — ABNORMAL HIGH (ref 11.6–15.2)

## 2013-10-19 LAB — MAGNESIUM: Magnesium: 2.5 mg/dL (ref 1.5–2.5)

## 2013-10-19 LAB — RAPID URINE DRUG SCREEN, HOSP PERFORMED
AMPHETAMINES: NOT DETECTED
Barbiturates: NOT DETECTED
Benzodiazepines: POSITIVE — AB
COCAINE: NOT DETECTED
Opiates: NOT DETECTED
TETRAHYDROCANNABINOL: NOT DETECTED

## 2013-10-19 LAB — LACTIC ACID, PLASMA: LACTIC ACID, VENOUS: 0.9 mmol/L (ref 0.5–2.2)

## 2013-10-19 LAB — POCT I-STAT, CHEM 8
BUN: 15 mg/dL (ref 6–23)
CHLORIDE: 110 meq/L (ref 96–112)
Calcium, Ion: 0.87 mmol/L — ABNORMAL LOW (ref 1.12–1.23)
Creatinine, Ser: 0.9 mg/dL (ref 0.50–1.35)
Glucose, Bld: 242 mg/dL — ABNORMAL HIGH (ref 70–99)
HEMATOCRIT: 26 % — AB (ref 39.0–52.0)
Hemoglobin: 8.8 g/dL — ABNORMAL LOW (ref 13.0–17.0)
POTASSIUM: 5.2 meq/L (ref 3.7–5.3)
Sodium: 140 mEq/L (ref 137–147)
TCO2: 16 mmol/L (ref 0–100)

## 2013-10-19 LAB — I-STAT TROPONIN, ED: Troponin i, poc: 0.29 ng/mL (ref 0.00–0.08)

## 2013-10-19 LAB — MRSA PCR SCREENING: MRSA by PCR: NEGATIVE

## 2013-10-19 LAB — URINE MICROSCOPIC-ADD ON

## 2013-10-19 LAB — CREATININE, SERUM
CREATININE: 0.76 mg/dL (ref 0.50–1.35)
GFR calc Af Amer: >90 mL/min (ref >90)
GFR calc non Af Amer: >90 mL/min (ref >90)

## 2013-10-19 LAB — CK TOTAL AND CKMB (NOT AT ARMC)
CK, MB: 20.5 ng/mL — AB (ref 0.3–4.0)
Relative Index: 6.6 — ABNORMAL HIGH (ref 0.0–2.5)
Total CK: 310 U/L — ABNORMAL HIGH (ref 7–232)

## 2013-10-19 LAB — POCT ACTIVATED CLOTTING TIME: Activated Clotting Time: 219 seconds

## 2013-10-19 LAB — APTT: aPTT: 30 seconds (ref 24–37)

## 2013-10-19 LAB — TROPONIN I: Troponin I: <0.3 ng/mL (ref <0.30)

## 2013-10-19 SURGERY — LEFT HEART CATHETERIZATION WITH CORONARY ANGIOGRAM

## 2013-10-19 MED ORDER — TICAGRELOR 90 MG PO TABS
90.0000 mg | ORAL_TABLET | Freq: Two times a day (BID) | ORAL | Status: DC
  Filled 2013-10-19: qty 1

## 2013-10-19 MED ORDER — HEPARIN (PORCINE) IN NACL 2-0.9 UNIT/ML-% IJ SOLN
INTRAMUSCULAR | Status: AC
  Filled 2013-10-19: qty 500

## 2013-10-19 MED ORDER — AMIODARONE IV BOLUS ONLY 150 MG/100ML: 150.0000 mg | Freq: Once | INTRAVENOUS | Status: DC

## 2013-10-19 MED ORDER — EMTRICITABINE-TENOFOVIR DF 200-300 MG PO TABS
1.0000 | ORAL_TABLET | Freq: Every day | ORAL | Status: DC
Start: 2013-10-19 — End: 2013-10-19
  Filled 2013-10-19: qty 1

## 2013-10-19 MED ORDER — DARUNAVIR ETHANOLATE 800 MG PO TABS: 800.0000 mg | ORAL_TABLET | ORAL | Status: DC

## 2013-10-19 MED ORDER — VERAPAMIL HCL 2.5 MG/ML IV SOLN
INTRAVENOUS | Status: AC
  Filled 2013-10-19: qty 2

## 2013-10-19 MED ORDER — TICAGRELOR 90 MG PO TABS
180.0000 mg | ORAL_TABLET | Freq: Once | ORAL | Status: DC
  Filled 2013-10-19: qty 2

## 2013-10-19 MED ORDER — CISATRACURIUM BOLUS VIA INFUSION
0.1000 mg/kg | Freq: Once | INTRAVENOUS | Status: DC
  Filled 2013-10-19: qty 6

## 2013-10-19 MED ORDER — ONDANSETRON HCL 4 MG/2ML IJ SOLN: 4.0000 mg | Freq: Four times a day (QID) | INTRAMUSCULAR | Status: DC | PRN

## 2013-10-19 MED ORDER — RITONAVIR 100 MG PO CAPS: 100.0000 mg | ORAL_CAPSULE | Freq: Two times a day (BID) | ORAL | Status: DC

## 2013-10-19 MED ORDER — HEPARIN SODIUM (PORCINE) 5000 UNIT/ML IJ SOLN: 5000.0000 [IU] | Freq: Three times a day (TID) | INTRAMUSCULAR | Status: DC

## 2013-10-19 MED ORDER — FENTANYL CITRATE 0.05 MG/ML IJ SOLN
INTRAMUSCULAR | Status: AC
  Filled 2013-10-19: qty 2

## 2013-10-19 MED ORDER — SODIUM CHLORIDE 0.9 % IV SOLN
1.0000 ug/kg/min | INTRAVENOUS | Status: DC
  Administered 2013-10-19: 1 ug/kg/min via INTRAVENOUS
  Filled 2013-10-19: qty 20

## 2013-10-19 MED ORDER — VASOPRESSIN 20 UNIT/ML IJ SOLN
0.0300 [IU]/min | INTRAVENOUS | Status: DC
  Administered 2013-10-19: 0.03 [IU]/min via INTRAVENOUS
  Filled 2013-10-19: qty 2

## 2013-10-19 MED ORDER — TIROFIBAN HCL IV 5 MG/100ML
0.1500 ug/kg/min | INTRAVENOUS | Status: AC
  Administered 2013-10-19: 0.15 ug/kg/min via INTRAVENOUS
  Filled 2013-10-19 (×2): qty 100

## 2013-10-19 MED ORDER — HEPARIN SODIUM (PORCINE) 5000 UNIT/ML IJ SOLN
4000.0000 [IU] | Freq: Once | INTRAMUSCULAR | Status: AC
  Administered 2013-10-19: 4000 [IU] via INTRAVENOUS

## 2013-10-19 MED ORDER — SODIUM CHLORIDE 0.9 % IV SOLN
INTRAVENOUS | Status: DC
  Filled 2013-10-19: qty 2.5

## 2013-10-19 MED ORDER — PANTOPRAZOLE SODIUM 40 MG IV SOLR: 40.0000 mg | INTRAVENOUS | Status: DC

## 2013-10-19 MED ORDER — ASPIRIN 81 MG PO CHEW: 81.0000 mg | CHEWABLE_TABLET | Freq: Every day | ORAL | Status: DC

## 2013-10-19 MED ORDER — ASPIRIN 81 MG PO CHEW: 324.0000 mg | CHEWABLE_TABLET | Freq: Once | ORAL | Status: DC

## 2013-10-19 MED ORDER — FENTANYL CITRATE 0.05 MG/ML IJ SOLN: 100.0000 ug | INTRAMUSCULAR | Status: DC | PRN

## 2013-10-19 MED ORDER — SODIUM CHLORIDE 0.9 % IV SOLN
1.0000 mL/kg/h | INTRAVENOUS | Status: AC
  Administered 2013-10-19: 17.619 mL/h via INTRAVENOUS

## 2013-10-19 MED ORDER — NOREPINEPHRINE BITARTRATE 1 MG/ML IV SOLN
0.5000 ug/min | INTRAVENOUS | Status: DC
  Administered 2013-10-19: 100 ug/min via INTRAVENOUS
  Filled 2013-10-19: qty 16

## 2013-10-19 MED ORDER — AMIODARONE HCL IN DEXTROSE 360-4.14 MG/200ML-% IV SOLN
60.0000 mg/h | INTRAVENOUS | Status: AC
  Administered 2013-10-19: 60 mg/h via INTRAVENOUS
  Filled 2013-10-19: qty 200

## 2013-10-19 MED ORDER — ACYCLOVIR 400 MG PO TABS
400.0000 mg | ORAL_TABLET | Freq: Every day | ORAL | Status: DC
  Filled 2013-10-19: qty 1

## 2013-10-19 MED ORDER — ACETAMINOPHEN 325 MG PO TABS: 650.0000 mg | ORAL_TABLET | ORAL | Status: DC | PRN

## 2013-10-19 MED ORDER — TIROFIBAN HCL IV 5 MG/100ML
INTRAVENOUS | Status: AC
  Filled 2013-10-19: qty 100

## 2013-10-19 MED ORDER — PROPOFOL 10 MG/ML IV EMUL
5.0000 ug/kg/min | Freq: Once | INTRAVENOUS | Status: AC
  Administered 2013-10-19: 20 ug/kg/min via INTRAVENOUS

## 2013-10-19 MED ORDER — AMIODARONE HCL IN DEXTROSE 360-4.14 MG/200ML-% IV SOLN
30.0000 mg/h | INTRAVENOUS | Status: DC
  Filled 2013-10-19: qty 200

## 2013-10-19 MED ORDER — SODIUM CHLORIDE 0.9 % IV SOLN
1.5000 g | Freq: Four times a day (QID) | INTRAVENOUS | Status: DC
  Filled 2013-10-19 (×4): qty 1.5

## 2013-10-19 MED ORDER — STERILE WATER FOR INJECTION IV SOLN
INTRAVENOUS | Status: DC
  Filled 2013-10-19 (×3): qty 850

## 2013-10-19 MED ORDER — FENTANYL BOLUS VIA INFUSION
50.0000 ug | INTRAVENOUS | Status: DC | PRN
Start: 2013-10-19 — End: 2013-10-19
  Filled 2013-10-19: qty 50

## 2013-10-19 MED ORDER — SODIUM CHLORIDE 0.9 % IV SOLN
25.0000 ug/h | INTRAVENOUS | Status: DC
  Administered 2013-10-19: 100 ug/h via INTRAVENOUS
  Filled 2013-10-19: qty 50

## 2013-10-19 MED ORDER — HYDROCORTISONE NA SUCCINATE PF 100 MG IJ SOLR
100.0000 mg | Freq: Four times a day (QID) | INTRAMUSCULAR | Status: DC
  Filled 2013-10-19 (×4): qty 2

## 2013-10-19 MED ORDER — PROPOFOL 10 MG/ML IV EMUL
INTRAVENOUS | Status: DC
Start: 2013-10-19 — End: 2013-10-19
  Filled 2013-10-19: qty 100

## 2013-10-19 MED ORDER — MIDAZOLAM HCL 2 MG/2ML IJ SOLN
INTRAMUSCULAR | Status: AC
  Filled 2013-10-19: qty 2

## 2013-10-19 MED ORDER — FENTANYL CITRATE 0.05 MG/ML IJ SOLN: 100.0000 ug | Freq: Once | INTRAMUSCULAR | Status: DC

## 2013-10-19 MED ORDER — HEPARIN SODIUM (PORCINE) 5000 UNIT/ML IJ SOLN
INTRAMUSCULAR | Status: AC
  Filled 2013-10-19: qty 1

## 2013-10-19 MED ORDER — NITROGLYCERIN 1 MG/10 ML FOR IR/CATH LAB
INTRA_ARTERIAL | Status: AC
  Filled 2013-10-19: qty 10

## 2013-10-19 MED ORDER — ARTIFICIAL TEARS OP OINT: 1.0000 "application " | TOPICAL_OINTMENT | Freq: Three times a day (TID) | OPHTHALMIC | Status: DC

## 2013-10-19 MED ORDER — CISATRACURIUM BOLUS VIA INFUSION
0.0500 mg/kg | INTRAVENOUS | Status: DC | PRN
  Filled 2013-10-19: qty 3

## 2013-10-19 MED ORDER — ASPIRIN 300 MG RE SUPP
300.0000 mg | RECTAL | Status: AC
  Administered 2013-10-19: 300 mg via RECTAL
  Filled 2013-10-19: qty 1

## 2013-10-19 MED ORDER — CHLORHEXIDINE GLUCONATE 0.12 % MT SOLN
15.0000 mL | Freq: Two times a day (BID) | OROMUCOSAL | Status: DC
Start: 2013-10-19 — End: 2013-10-19

## 2013-10-19 MED ORDER — LIDOCAINE HCL (PF) 1 % IJ SOLN
INTRAMUSCULAR | Status: AC
  Filled 2013-10-19: qty 30

## 2013-10-19 MED ORDER — HYDROCORTISONE SOD SUCCINATE 100 MG PF FOR IT USE
100.0000 mg | Freq: Four times a day (QID) | INTRAMUSCULAR | Status: DC
  Filled 2013-10-19 (×2): qty 1

## 2013-10-19 MED ORDER — CETYLPYRIDINIUM CHLORIDE 0.05 % MT LIQD
7.0000 mL | Freq: Four times a day (QID) | OROMUCOSAL | Status: DC
  Administered 2013-10-19: 7 mL via OROMUCOSAL

## 2013-10-19 MED ORDER — HEPARIN SODIUM (PORCINE) 1000 UNIT/ML IJ SOLN
INTRAMUSCULAR | Status: AC
  Filled 2013-10-19: qty 1

## 2013-10-19 MED ORDER — DEXTROSE 5 % IV SOLN
0.5000 ug/min | INTRAVENOUS | Status: DC
  Administered 2013-10-19: 10 ug/min via INTRAVENOUS
  Filled 2013-10-19 (×2): qty 4

## 2013-10-19 MED ORDER — PROPOFOL 10 MG/ML IV EMUL
5.0000 ug/kg/min | INTRAVENOUS | Status: DC
  Administered 2013-10-19: 20 ug/kg/min via INTRAVENOUS
  Filled 2013-10-19: qty 100

## 2013-10-19 MED ORDER — SODIUM CHLORIDE 0.9 % IV BOLUS (SEPSIS)
500.0000 mL | Freq: Once | INTRAVENOUS | Status: AC
  Administered 2013-10-19: 10:00:00 via INTRAVENOUS

## 2013-10-19 MED ORDER — AMIODARONE LOAD VIA INFUSION
150.0000 mg | Freq: Once | INTRAVENOUS | Status: AC
  Administered 2013-10-19: 150 mg via INTRAVENOUS
  Filled 2013-10-19: qty 83.34

## 2013-10-19 MED ORDER — AMIODARONE LOAD VIA INFUSION
150.0000 mg | Freq: Once | INTRAVENOUS | Status: DC
  Filled 2013-10-19: qty 83.34

## 2013-10-19 MED ORDER — SODIUM CHLORIDE 0.9 % IV SOLN
2000.0000 mL | Freq: Once | INTRAVENOUS | Status: AC
  Administered 2013-10-19: 1000 mL via INTRAVENOUS

## 2013-10-19 MED FILL — Medication: Qty: 1 | Status: AC

## 2013-10-20 LAB — GLUCOSE, CAPILLARY
GLUCOSE-CAPILLARY: 306 mg/dL — AB (ref 70–99)
Glucose-Capillary: 160 mg/dL — ABNORMAL HIGH (ref 70–99)
Glucose-Capillary: 223 mg/dL — ABNORMAL HIGH (ref 70–99)

## 2013-10-22 LAB — CULTURE, RESPIRATORY W GRAM STAIN: Gram Stain: NONE SEEN

## 2013-10-22 LAB — CULTURE, RESPIRATORY

## 2013-10-25 LAB — CULTURE, BLOOD (ROUTINE X 2): CULTURE: NO GROWTH

## 2013-11-15 NOTE — Procedures (Signed)
ACLS  Called for asytole Just re rounded see note Was on 175 mic levophed  Asytole, EPI, ACLS protocol Unable to resuscitate  Pronounced dead Evan Bentley. Tyson Alias, MD, FACP Pgr: [phone] [facility]

## 2013-11-15 NOTE — Progress Notes (Signed)
Multiple attempts to place OG tube with resistance met twice. Serous drainage/secretions appeared on tube and in mouth. Pt suctioned, MD made aware.

## 2013-11-15 NOTE — Progress Notes (Signed)
**Note De-Identified Evan Bentley Obfuscation** PV on ABG reported to RN.

## 2013-11-15 NOTE — Progress Notes (Signed)
ANTIBIOTIC CONSULT NOTE - INITIAL  Pharmacy Consult for Unasyn Indication: rule out pneumonia  Allergies  Allergen Reactions  . Citric Acid Other (See Comments)    Bumps on skin  . Orange Fruit [Citrus] Other (See Comments)    Bumps on skin    Patient Measurements: Height: 5\' 6"  (167.6 cm) Weight: 125 lb (56.7 kg) (from 09/08/13) IBW/kg (Calculated) : 63.8  Vital Signs: BP: 116/73 mmHg (10/05 0900) Pulse Rate: 104 (10/05 0830) Intake/Output from previous day:   Intake/Output from this shift: Total I/O In: 6.8 [I.V.:6.8] Out: 325 [Urine:325]  Labs:  Recent Labs  2013/11/05 0552 11/05/2013 0900  WBC 10.0 10.5  HGB 11.5* 9.3*  PLT 122* 148*  CREATININE 0.85  --    Estimated Creatinine Clearance: 81.5 ml/min (by C-G formula based on Cr of 0.85). No results found for this basename: VANCOTROUGH, VANCOPEAK, VANCORANDOM, GENTTROUGH, GENTPEAK, GENTRANDOM, TOBRATROUGH, TOBRAPEAK, TOBRARND, AMIKACINPEAK, AMIKACINTROU, AMIKACIN,  in the last 72 hours   Microbiology: No results found for this or any previous visit (from the past 720 hour(s)).  Medical History: Past Medical History  Diagnosis Date  . COPD (chronic obstructive pulmonary disease)   . Hypertension   . HIV (human immunodeficiency virus infection)   . Hyperlipemia   . Herpes   . Hepatitis B   . Neuropathy     Medications:  Prescriptions prior to admission  Medication Sig Dispense Refill  . acyclovir (ZOVIRAX) 400 MG tablet Take 400 mg by mouth daily.        Marland Kitchen albuterol (PROAIR HFA) 108 (90 BASE) MCG/ACT inhaler Inhale 2 puffs into the lungs every 4 (four) hours as needed. For shortness of breath      . ALPRAZolam (XANAX) 0.25 MG tablet Take 0.25 mg by mouth at bedtime as needed. For anxiety      . amLODipine (NORVASC) 5 MG tablet Take 5 mg by mouth daily.      . cloNIDine (CATAPRES) 0.2 MG tablet Take 0.2 mg by mouth 1 day or 1 dose.      . darunavir (PREZISTA) 400 MG tablet Take 800 mg by mouth 1 day or 1  dose.       Marland Kitchen emtricitabine-tenofovir (TRUVADA) 200-300 MG per tablet Take 1 tablet by mouth daily.        . hydrALAZINE (APRESOLINE) 50 MG tablet Take 50 mg by mouth 3 (three) times daily.      Marland Kitchen HYDROcodone-acetaminophen (NORCO) 10-325 MG per tablet Take 1 tablet by mouth every 6 (six) hours as needed.      Marland Kitchen oxyCODONE-acetaminophen (PERCOCET/ROXICET) 5-325 MG per tablet Take 1-2 tablets by mouth every 6 (six) hours as needed for pain.  20 tablet  0  . ritonavir (NORVIR) 100 MG capsule Take 100 mg by mouth 2 (two) times daily.          Assessment: 53 yo M admitted November 05, 2013 With CP then PEA arrest with 7 min CPR, STEMI noted, then cath lab PCI PDA, to follow with hypothermia. Pharmacy following with cardiology and CCM  PMH: HIV, COPD, HTN, Hep B, herpes  Coag: Heparin dcd post cath, VTE Px, SQ heparin  Cards: CAD, PEA arrest, afib (onset post cath) IV tirofiban till asa/ticagrelor started, On: ASA, ticagrelor, amio.  Follow up BP and renal function for addition of ACEI and BB.  ID: HIV ritonovir 100 bid, truvada daily, prezista daily> will hold these medications for now given drug interactions with ticagrelor.  acyclovir 400 daily FYI - ritonavir cannot be crushed  but susp, darunavir can be crushed, truvada can be crushed * will need id consult for hiv regimen redo due to PI drug interactions & lipid effect sp rewarming, hold HIV medications for now  Goal of Therapy:  Renal adjustment of antibiotics.  Plan:  1. Unasyn 1.5 g IV q6h 2. Follow up SCr, UOP, cultures, clinical course and adjust as clinically indicated. 3. Tirofiban x 8h post cath, follow 8h CBC for platelet count.  Thank you for allowing pharmacy to be a part of this patients care team.  Lovenia KimJulie Abbygail Willhoite Pharm.D., BCPS, AQ-Cardiology Clinical Pharmacist 11/01/2013 9:52 AM Pager: [phone] Phone: [phone]

## 2013-11-15 NOTE — Progress Notes (Signed)
ANTICOAGULATION CONSULT NOTE - Initial Consult  Pharmacy Consult for Heparin  Indication: chest pain/ACS, CODE STEMI,   Assessment: CODE STEMI, s/p CPR with ROSC, Heparin 4000 units given in the ED, off the floor to CATH   Plan:  -Heparin BOLUS given in ED -F/U additional heparin plans post-cath  Abran Duke 2013/11/11,6:18 AM

## 2013-11-15 NOTE — Progress Notes (Signed)
**Note De-Identified Evan Bentley Obfuscation** PV on ABG reported to RN, sputum collected and sent to lab.

## 2013-11-15 NOTE — Code Documentation (Signed)
Patient was hemodynamically unstable from refractory shock s/p PEA arrest, MI, cath lab procedure. He was on 16mcg/min of levophed and vasopressin, but remained profoundly hypotensive with persistent acidosis. I was outside the room and heard monitor alarm. I entered the room to find the patient with pulses in the 30s. Upon assessing for a pulse his rhythm changed to asystole. A code was called and ACLS protocol was initiated. See code sheet for further details and documentation.   ACLS was ultimately ineffective for Evan Bentley, efforts stopped at 12:28 with no ROSC, absent pulses, no heart sounds auscultated and no spontaneous respirations. Time of death 05-20-26. Verified with Dr. Tyson Alias, who was present for code.   Joneen Roach, ACNP [facility] Pulmonology/Critical Care Pager [phone] or [phone]

## 2013-11-15 NOTE — Progress Notes (Signed)
Called sister Evan Bentley on her cell at [phone] to update her on her brothers status.  She indicates he has another brother in [place] but he is working in [place].  I proceeded to update her on her brothers status over the phone due to the critical nature of his illness.  She quickly stopped the conversation and said she "couldn't talk about this right now because she didn't want to get upset".  She indicated she was getting her oil changed and would be there as soon as she could.  Informed her that he is very critically ill and she needs to get to the hospital as soon as she safely can.    Evan Brim, NP-C [facility] Pgr: [phone] or [phone]     As above, with arrest , re calling  Evan Bentley. Evan Alias, MD, FACP Pgr: [phone]  Pulmonary & Critical Care

## 2013-11-15 NOTE — ED Notes (Signed)
Troponin results given to Dr. Juleen China

## 2013-11-15 NOTE — H&P (Addendum)
PULMONARY / CRITICAL CARE MEDICINE   Name: Evan Bentley MRN: [id number] DOB: 20-Jan-1960    ADMISSION DATE:  06-Nov-2013   REFERRING MD :  Cards  CHIEF COMPLAINT:  Chest pain  INITIAL PRESENTATION: PEA arrest  STUDIES:  10/5 PEA arrest  SIGNIFICANT EVENTS: 10/5 STEMI   HISTORY OF PRESENT ILLNESS:   53 yo(looks older) with a host of medical co morbidities (hiv,copd,htn,hep b, herpes, ) who EMS was called to pick up at 0445 for chest pain. He was reported to have PEA arrest, CPR x 7 minutes, one Epi with ROSC. He was taken to cath lab at 0605 for Cardiac Cath for STEMI. PCCM called for admission and to initiate Hypothermia protocol.  PAST MEDICAL HISTORY :   has a past medical history of COPD (chronic obstructive pulmonary disease); Hypertension; HIV (human immunodeficiency virus infection); Hyperlipemia; Herpes; Hepatitis B; and Neuropathy.  has past surgical history that includes Eye surgery and Hernia repair. Prior to Admission medications   Medication Sig Start Date End Date Taking? Authorizing Provider  acyclovir (ZOVIRAX) 400 MG tablet Take 400 mg by mouth daily.      Historical Provider, MD  albuterol (PROAIR HFA) 108 (90 BASE) MCG/ACT inhaler Inhale 2 puffs into the lungs every 4 (four) hours as needed. For shortness of breath    Historical Provider, MD  ALPRAZolam (XANAX) 0.25 MG tablet Take 0.25 mg by mouth at bedtime as needed. For anxiety    Historical Provider, MD  amLODipine (NORVASC) 5 MG tablet Take 5 mg by mouth daily.    Historical Provider, MD  cloNIDine (CATAPRES) 0.2 MG tablet Take 0.2 mg by mouth 1 day or 1 dose.    Historical Provider, MD  darunavir (PREZISTA) 400 MG tablet Take 800 mg by mouth 1 day or 1 dose.     Historical Provider, MD  emtricitabine-tenofovir (TRUVADA) 200-300 MG per tablet Take 1 tablet by mouth daily.      Historical Provider, MD  hydrALAZINE (APRESOLINE) 50 MG tablet Take 50 mg by mouth 3 (three) times daily.    Historical Provider,  MD  HYDROcodone-acetaminophen (NORCO) 10-325 MG per tablet Take 1 tablet by mouth every 6 (six) hours as needed.    Historical Provider, MD  oxyCODONE-acetaminophen (PERCOCET/ROXICET) 5-325 MG per tablet Take 1-2 tablets by mouth every 6 (six) hours as needed for pain. 01/24/12   Arman Filter, NP  ritonavir (NORVIR) 100 MG capsule Take 100 mg by mouth 2 (two) times daily.      Historical Provider, MD   Allergies  Allergen Reactions  . Citric Acid Other (See Comments)    Bumps on skin  . Orange Fruit [Citrus] Other (See Comments)    Bumps on skin    FAMILY HISTORY:  has no family status information on file.  SOCIAL HISTORY:  reports that he has been smoking Cigarettes.  He has a 5 pack-year smoking history. He does not have any smokeless tobacco history on file. He reports that he drinks alcohol. He reports that he does not use illicit drugs.  REVIEW OF SYSTEMS:  NA  SUBJECTIVE:   VITAL SIGNS: Pulse Rate:  [84-102] 84 (10/05 0624) Resp:  [24] 24 (10/05 0554) BP: (150)/(133) 150/133 mmHg (10/05 0554) SpO2:  [100 %] 100 % (10/05 0554) FiO2 (%):  [100 %] 100 % (10/05 0803) Weight:  [125 lb (56.7 kg)] 125 lb (56.7 kg) (10/05 0559) HEMODYNAMICS:   VENTILATOR SETTINGS: Vent Mode:  [-] PRVC FiO2 (%):  [100 %] 100 %  Set Rate:  [12 bmp-14 bmp] 14 bmp Vt Set:  [450 mL-500 mL] 450 mL PEEP:  [5 cmH20] 5 cmH20 Plateau Pressure:  [22 cmH20-28 cmH20] 28 cmH20 INTAKE / OUTPUT: No intake or output data in the 24 hours ending 03-12-2013 0844  PHYSICAL EXAMINATION: General:  Thin, appears older than stated age. Sedated and intubated Neuro:  Sedated on vent HEENT:  No JVD/LAN Cardiovascular:  HSR RRR Lungs:  Diminished BS left > right  Abdomen:  Flat, + bs Musculoskeletal: intact Skin:  Cool dry  LABS:  CBC  Recent Labs Lab 03-12-2013 0552  WBC 10.0  HGB 11.5*  HCT 37.4*  PLT 122*   Coag's  Recent Labs Lab 03-12-2013 0552  APTT 30  INR 1.32   BMET  Recent Labs Lab  03-12-2013 0552  NA 141  K 3.3*  CL 103  CO2 15*  BUN 15  CREATININE 0.85  GLUCOSE 216*   Electrolytes  Recent Labs Lab 03-12-2013 0552  CALCIUM 8.7  MG 2.5   Sepsis Markers No results found for this basename: LATICACIDVEN, PROCALCITON, O2SATVEN,  in the last 168 hours ABG No results found for this basename: PHART, PCO2ART, PO2ART,  in the last 168 hours Liver Enzymes No results found for this basename: AST, ALT, ALKPHOS, BILITOT, ALBUMIN,  in the last 168 hours Cardiac Enzymes  Recent Labs Lab 03-12-2013 0552  TROPONINI <0.30   Glucose No results found for this basename: GLUCAP,  in the last 168 hours  Imaging No results found.   ASSESSMENT / PLAN:  PULMONARY OETT 10/5>> A: Acute resp failure secondary to cardiac arrest. COPD P:   Vent bundle Hypothermia protocol F/u CXR  CARDIOVASCULAR CVL A:  STEMI with 7 minutes of CPR P:  To cath lab Hypothermia protocol  RENAL A:   Hypokalemia Anion gap acidosis P:   Monitor renal fx, urine outpt Replace electrolytes as needed F/u lactic acid, ABG  GASTROINTESTINAL A:   Gi Protetion P:   PPI  HEMATOLOGIC A:   thrombocytopenia P:  F/u CBC  INFECTIOUS A HIV + Hx of Hep B and Herpes Left perihilar infiltrate ?aspiration P:   Check CD4 ID consult for resumption of antivirals when more stable Send blood, sputum cx 10/05 Add unasyn 10/05  ENDOCRINE A:   No acute issue P:   Monitor blood sugar while on hypothermia  NEUROLOGIC A:   Unknown if neurological injury has occurred. Short down time.  P:   RASS goal: -4 while on hypothermia May need CT head   Family updated:  None  Interdisciplinary Family Meeting v Palliative Care Meeting:    TODAY'S SUMMARY:  53 yo(looks older) with a host of medical co morbidities (hiv,copd,htn,hep b, herpes, ) who EMS was called to pick up at 0445 for chest pain. He was reported to have PEA arrest, CPR x 7 minutes, one Epi with ROSC. He was taken to  cath lab at 0605 for Cardiac Cath for STEMI. PCCM called for admission and to initiate Hypothermia protocol.  Brett CanalesSteve Minor ACNP [facility] Pager [phone] till 3 pm If no answer page [phone] 11/13/2013, 6:32 AM   Reviewed above, examined.    53 yo with PEA arrest, STEMI s/p cath.  Will initiate artic sun hypothermia protocol.  Will need to re-assess neuro status after rewarming.  Plan d/w Dr. Eldridge DaceVaranasi.  CC time 50 minutes.  Coralyn HellingVineet Domani Bakos, MD [facility] 11/07/2013, 8:44 AM Pager:  [phone] After 3pm call: [phone]

## 2013-11-15 NOTE — ED Notes (Signed)
Patient to cath lab.

## 2013-11-15 NOTE — Procedures (Signed)
Central Venous Catheter Insertion Procedure Note ELIAN VANVALKENBURGH [id number] [birth year]  Procedure: Insertion of Central Venous Catheter Indications: Drug and/or fluid administration  Procedure Details Consent: Unable to obtain consent because of altered level of consciousness. Time Out: Verified patient identification, verified procedure, site/side was marked, verified correct patient position, special equipment/implants available, medications/allergies/relevent history reviewed, required imaging and test results available.  Performed  Maximum sterile technique was used including antiseptics, cap, gloves, gown, hand hygiene, mask and sheet. Skin prep: Chlorhexidine; local anesthetic administered A antimicrobial bonded/coated triple lumen catheter was placed in the left internal jugular vein using the Seldinger technique.  Evaluation Blood flow good Complications: No apparent complications Patient did tolerate procedure well. Chest X-ray ordered to verify placement.  CXR: pending.  Performed by Devra Dopp, ACNP with u/s guidance.  I was present for procedure.  Coralyn Helling, MD [facility] Pulmonary/Critical Care 2013/11/08, 8:58 AM Pager:  [phone] After 3pm call: [phone]
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
## 2013-11-15 NOTE — Progress Notes (Addendum)
PULMONARY / CRITICAL CARE MEDICINE   Name: Evan Bentley MRN: [id number] DOB: 1960/10/04    ADMISSION DATE:  10/26/13   REFERRING MD :  Cardiology  CHIEF COMPLAINT:  Chest pain  INITIAL PRESENTATION: PEA arrest  STUDIES:   SIGNIFICANT EVENTS: 10/5 STEMI, EPA arrest,  cath - Occluded PDA treated with balloon angioplasty, to ICU on vent, cooling.   HISTORY OF PRESENT ILLNESS:   53 yo(looks older) with a host of medical co morbidities (hiv,copd,htn,hep b, herpes, ) who EMS was called to pick up at 0445 for chest pain. He was reported to have PEA arrest, CPR x 7 minutes, one Epi with ROSC. He was taken to cath lab at 0605 for Cardiac Cath for STEMI. PCCM called for admission and to initiate Hypothermia protocol.  INTERVAL HISTORY: Remains hypotensive despite aggressive pressors. Developed AF RVR so started amio, then sinus brady at 50bpm, amio stopped. Cooling per protocol.   VITAL SIGNS: Pulse Rate:  [84-104] 104 (10/05 0830) Resp:  [14-24] 14 (10/05 0900) BP: (116-192)/(73-133) 116/73 mmHg (10/05 0900) SpO2:  [100 %] 100 % (10/05 0830) Arterial Line BP: (131-179)/(77-92) 131/77 mmHg (10/05 0900) FiO2 (%):  [60 %-100 %] 60 % (10/05 1041) Weight:  [56.7 kg (125 lb)] 56.7 kg (125 lb) (10/05 0559) HEMODYNAMICS:   VENTILATOR SETTINGS: Vent Mode:  [-] PRVC FiO2 (%):  [60 %-100 %] 60 % Set Rate:  [12 bmp-26 bmp] 26 bmp Vt Set:  [450 mL-500 mL] 450 mL PEEP:  [5 cmH20] 5 cmH20 Plateau Pressure:  [22 cmH20-28 cmH20] 28 cmH20 INTAKE / OUTPUT:  Intake/Output Summary (Last 24 hours) at Oct 26, 2013 1131 Last data filed at 26-Oct-2013 1010  Gross per 24 hour  Intake  506.8 ml  Output    385 ml  Net  121.8 ml    PHYSICAL EXAMINATION: General:  Thin, appears older than stated age. Sedated and intubated Neuro:  Sedated on vent HEENT:  No JVD/LAN Cardiovascular:  HSR RRR Lungs:  Diminished BS left > right  Abdomen:  Flat, + bs Musculoskeletal: intact Skin:  Cool  dry  LABS:  CBC  Recent Labs Lab 26-Oct-2013 0552 2013/10/26 0900  WBC 10.0 10.5  HGB 11.5* 9.3*  HCT 37.4* 28.0*  PLT 122* 148*   Coag's  Recent Labs Lab 2013/10/26 0552  APTT 30  INR 1.32   BMET  Recent Labs Lab Oct 26, 2013 0552 10/26/2013 0900  NA 141  --   K 3.3*  --   CL 103  --   CO2 15*  --   BUN 15  --   CREATININE 0.85 0.76  GLUCOSE 216*  --    Electrolytes  Recent Labs Lab Oct 26, 2013 0552  CALCIUM 8.7  MG 2.5   Sepsis Markers  Recent Labs Lab October 26, 2013 0848  LATICACIDVEN 0.9   ABG  Recent Labs Lab 10/26/2013 0920  PHART 7.174*  PCO2ART 54.7*  PO2ART 177.0*   Liver Enzymes No results found for this basename: AST, ALT, ALKPHOS, BILITOT, ALBUMIN,  in the last 168 hours Cardiac Enzymes  Recent Labs Lab 26-Oct-2013 0552  TROPONINI <0.30   Glucose No results found for this basename: GLUCAP,  in the last 168 hours  Imaging No results found.   ASSESSMENT / PLAN:  PULMONARY OETT 10/5 >>> A: Acute respiratory failure secondary to cardiac arrest. COPD Uncompensated met acidosis, post arrest R/o PE??  P:   Full vent support Follow ABG/CXR VAP prevention bundle Consider anticoagualtion and dopplers So unstable unable to CT Increase MV ,  rate ,repeat abg Bicarb for NONAG portion  CARDIOVASCULAR CVL A:  STEMI with 7 minutes of CPR (PEA) Cardiogenic shock - refractory AF RVR (resolved with amio) Sinus Brady (resolved with removal of amio)  P:  MAP goal 65 mm/Hg Levophed/Vaso to achieve MAP goal, hope to reduce levo with addition vaso D/c therapeutic hypothermia in setting of refractory shock Slow re-warm per protocol D/c amio as now brady ion SR Trend lactic acid Trend troponin Have maximized levo, no benefit increase further Get stat echo for complicating feature of MI, effusion, valvular etc Echo for RV Dopplers Consider empiric anticoagulation,  / lytcis  RENAL A:   Hypokalemia Anion gap metabolic acidosis  P:    Monitor renal fx, urine outpt Replace electrolytes as needed Bicarb for NONAG  GASTROINTESTINAL A:   GI protection  P:   PPI lft for shock liver risk  HEMATOLOGIC A:   Thrombocytopenia R/o DVt , PE P:  F/u CBC doplers Echo  INFECTIOUS A HIV + Hx of Hep B and Herpes Left perihilar infiltrate ?aspiration Pneumonia due to Strep pneumoniae P:   Check CD4 Blood 10/5 >>> Sputum 10/5 >>> Unasyn 10/05 ID consult for resumption of antivirals when more stable Consider add vanc, change to zosyn  ENDOCRINE A:   No acute issue  P:   Monitor blood sugar while on hypothermia  NEUROLOGIC A:   Unknown if neurological injury has occurred. Short down time.   P:   RASS goal: -4 while on hypothermia May need CT head Propofol gtt dc with shock fentanyl gtt while on hypothermia to achieve RASS Add versed Nimbex gtt  Family updated:  None Sister Donaciano Eva [phone], attempted to call [phone] 10/5. Will re-attempt  Interdisciplinary Family Meeting v Palliative Care Meeting:   Georgann Housekeeper, ACNP [facility] Pulmonology/Critical Care Pager [phone] or [phone]  TODAY'S SUMMARY:  Refractory shock 2nd to STEMI, PEA arrest. High demand for vasopressors, with minimal improvement. Need to d/c therapeutic hypothermia in this setting. Will attempt to address [place] with family. Poor prognosis   Ccm time 55 min further  I have fully examined this patient and agree with above findings.     and edited in full  Lavon Paganini. Titus Mould, MD, [place] Pgr: [facility]
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
## 2013-11-15 NOTE — CV Procedure (Addendum)
PROCEDURE:  Left heart catheterization with selective coronary angiography, left ventriculogram.  PCI PDA.  INDICATIONS:  Inferior STEMI, Cardiac arrest  Emergency consent was obtained.  PROCEDURE TECHNIQUE:  After Xylocaine anesthesia a 59F slender sheath was placed in the right radial artery with a single anterior needle wall stick. IV heparin was given.   Left coronary angiography was done using a Judkins L3.5 guide catheter. Right coronary angiography was done using a Judkins R4 guide catheter.  The intervention was performed. Please see below for details. Left ventriculography was done using a pigtail catheter.  A TR band was used for hemostasis.   CONTRAST:  Total of 120 cc.  COMPLICATIONS:  None.    HEMODYNAMICS:  Aortic pressure was 140/81; LV pressure was 137/9; LVEDP 12.  There was no gradient between the left ventricle and aorta.    ANGIOGRAPHIC DATA:   The left main coronary artery is widely patent.  The left anterior descending artery is a large vessel proximally. There is moderate to severe diffuse disease in the mid vessel, up to 70% in one segment. There are collaterals from the septals to the distal RCA territory. There are some very small diagonals which are patent.  The left circumflex artery is a large vessel. There is mild, proximal disease. The first obtuse marginal is diffusely diseased and is a small vessel. The second obtuse marginal has a proximal 80% stenosis. This is also a diffuse lesion.  The right coronary artery is a large dominant vessel. There is moderate, diffuse atherosclerosis in the mid vessel. The posterior lateral artery system is small and diffusely diseased, severely. The PDA is medium size vessel and is occluded in the midportion.  LEFT VENTRICULOGRAM:  Left ventricular angiogram was done in the 30 RAO projection and revealed a small area of inferior hypokinesis with overall normal systolic function and an estimated ejection fraction of 50  %.  LVEDP was 12 mmHg.  PCI NARRATIVE: A JR 4 guiding catheters using to engage the RCA. IV heparin was used for anticoagulation. An IV tirofiban was also started for antiplatelet agent since the patient had not received any oral antiplatelets. A pro-water wire was placed in the RCA and into the PDA across the occlusion. A 2.0 x 12 balloon was used to predilate the area of the PDA. After several balloon inflations, there was restoration of TIMI-3 flow.  The vessel was small and we decided to leave the balloon result.  We cannot be certain of the patient's compliance with long-term dual antiplatelet therapy. Intra-coronary nitroglycerin was administered.  IMPRESSIONS:  1. Widely patent left main coronary artery. 2. Moderately diseased mid left anterior descending artery with an area of severe disease, up to 70%. 3. Patent left circumflex artery. Severe diffuse disease in the first obtuse marginal. Significant proximal disease in the second obtuse marginal, which is a medium size vessel. 4. Diffuse disease in the right coronary artery.  Occluded PDA which was the culprit for today's presentation. This was successfully treated with balloon angioplasty with a 2.0 x 12 balloon. 5. Overall, Normal left ventricular systolic function.  LVEDP 12 mmHg.  Ejection fraction 50 %.  RECOMMENDATION:  The patient be watched in the ICU. [facility].  We'll start dual antiplatelet therapy. Continue IV tirofiban until he did get his oral antiplatelets. He'll need aggressive secondary prevention including smoking cessation, beta blocker, statin. Further management his coronary disease will depend on his neurologic recovery  and then symptoms at that time.

## 2013-11-15 NOTE — Progress Notes (Signed)
Utilization Review Completed.Evan Bentley T10/05/2013  

## 2013-11-15 NOTE — Progress Notes (Signed)
10/18/2013 1300  Clinical Encounter Type  Visited With Patient;Health care provider  Visit Type Code   Chaplain responded to a Code Blue page at 12:20 PM. Patient passed away. No family was present. Chaplain asked the patient's nurse to notify him if a friend or family showed up and needed support.   Gita Dilger, Tommi Emery, Chaplain 1:47 PM

## 2013-11-15 NOTE — ED Notes (Signed)
Patient defib at 120 j for rhythm change to v-fib, sinus rhythm post defib

## 2013-11-15 NOTE — Progress Notes (Signed)
Pt sisters at bedside given miscellaneous jewelry and patients belongings.

## 2013-11-15 NOTE — Procedures (Signed)
Arterial Catheter Insertion Procedure Note Evan Bentley [id number] Aug 07, 1960  Procedure: Insertion of Arterial Catheter  Indications: Blood pressure monitoring and Frequent blood sampling  Procedure Details Consent: Unable to obtain consent because of emergent medical necessity. Time Out: Verified patient identification, verified procedure, site/side was marked, verified correct patient position, special equipment/implants available, medications/allergies/relevent history reviewed, required imaging and test results available.  Performed  Maximum sterile technique was used including antiseptics, cap, gloves, gown, hand hygiene, mask and sheet. Skin prep: Chlorhexidine; local anesthetic administered 20 gauge catheter was inserted into left radial artery using the Seldinger technique.  Evaluation Blood flow good; BP tracing good. Complications: No apparent complications. RT placed arrow catheter on first attempt.   Morley Kos 05-Nov-2013

## 2013-11-15 NOTE — ED Notes (Signed)
ems called to home at approx 0445 for chest pain/nausea,  After EMS arrival enroute to ED patient lost pulses, cpr x 7 min, ROSC, epi x 1, patient with pulses present upon arrival

## 2013-11-15 DEATH — deceased

## 2013-11-16 NOTE — Discharge Summary (Signed)
Sivan MATIN HOLK was a 53 y.o. male admitted on 10/27/2013 with chest pain from STEMI and PEA cardiac arrest.  He had emergent cardiac catheterization.  He was started on hypothermia protocol.  He developed A fib with RVR and progressive shock.  He was started on Abx for presumed aspiration pneumonia.  He had hypothermia d/c due to hemodynamic instability.  He developed recurrent cardiac arrest, but resuscitative efforts were unsuccessful.  He subsequently expired on 10/25/2013 at 12:28 pm.  Final diagnoses: 1) PEA cardiac arrest 2) STEMI with CAD 3) Acute respiratory failure 4) Tobacco abuse 5) Cardiogenic shock 6) A fib with RVR 7) Metabolic acidosis 8) Bradycardia 9) Hypokalemia 10) Hx of COPD 11) Thrombocytopenia 12) Hx of HIV 13) Aspiration pneumonia 14) Septic shock 15) Hx of Hepatitis B 16) Anoxic encephalopathy  Coralyn Helling, MD [facility] Pulmonary/Critical Care 11/16/2013, 5:26 PM Pager:  [phone] After 3pm call: [phone]

## 2013-12-24 ENCOUNTER — Encounter (HOSPITAL_COMMUNITY): Payer: Self-pay | Admitting: Interventional Cardiology

## 2014-01-28 ENCOUNTER — Encounter (HOSPITAL_COMMUNITY): Payer: Self-pay | Admitting: Cardiology

## 2015-03-05 IMAGING — CR DG CHEST 1V PORT
1 series · 1 of 1 positions shown · non-contrast
Comparison: Plain film of the chest 10/19/2013 at [DATE] a.m.

CLINICAL DATA: Central line placement.

EXAM:
PORTABLE CHEST - 1 VIEW

[AP]
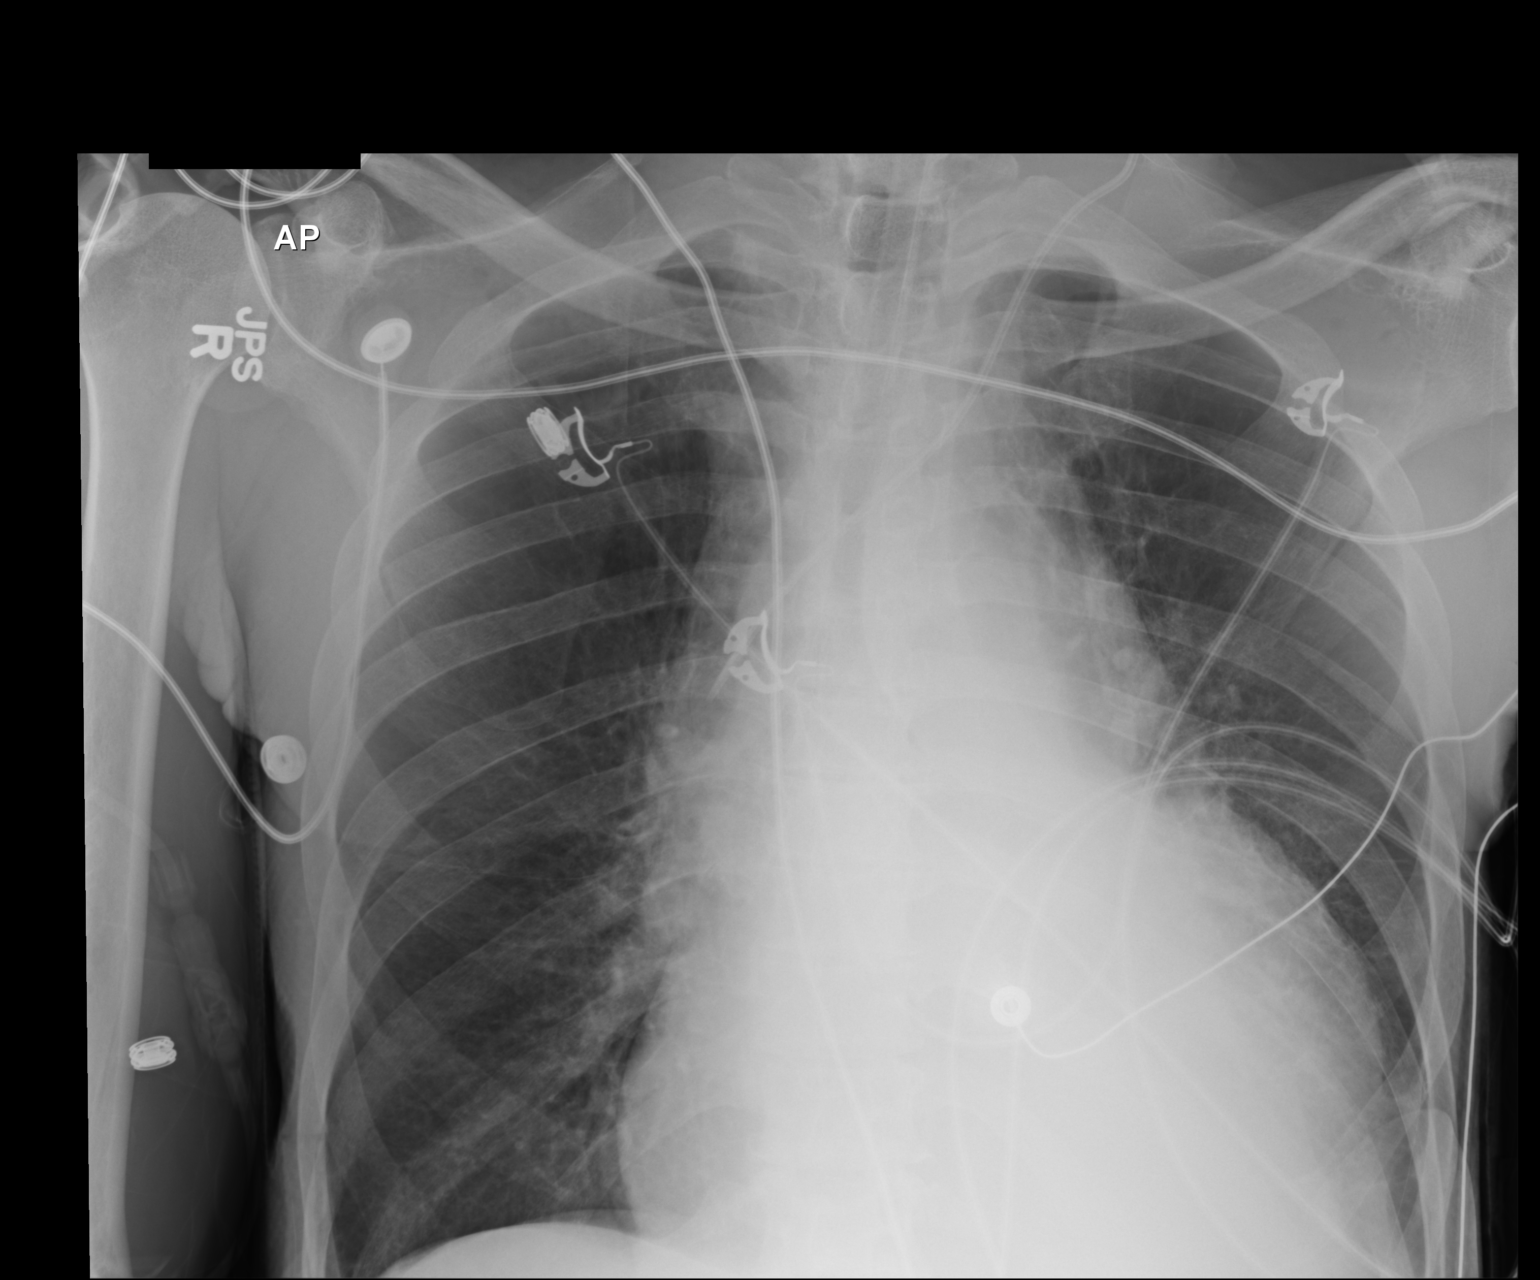

[1 of 1 positions shown; findings below may reference images not displayed]

FINDINGS: The patient has a new left IJ central venous catheter with the tip
projecting over the mid to lower superior vena cava. There is no
pneumothorax. Endotracheal tube remains in place in good position.
Marked cardiomegaly is again seen. Lingular airspace disease on the
left is better demonstrated on the comparison examination.
IMPRESSION: Left IJ catheter projects in good position. Negative for
pneumothorax.

Airspace disease in the lingula seen on the comparison study is not
well demonstrated on this exam.

Cardiomegaly without edema.

## 2015-03-05 IMAGING — CR DG CHEST 1V PORT
1 series · 1 of 1 positions shown · non-contrast
Comparison: Prior radiograph from 01/24/2012

CLINICAL DATA: Code STEMI, history of coronary artery disease,
hypertension.

EXAM:
PORTABLE CHEST - 1 VIEW

[ap portable]
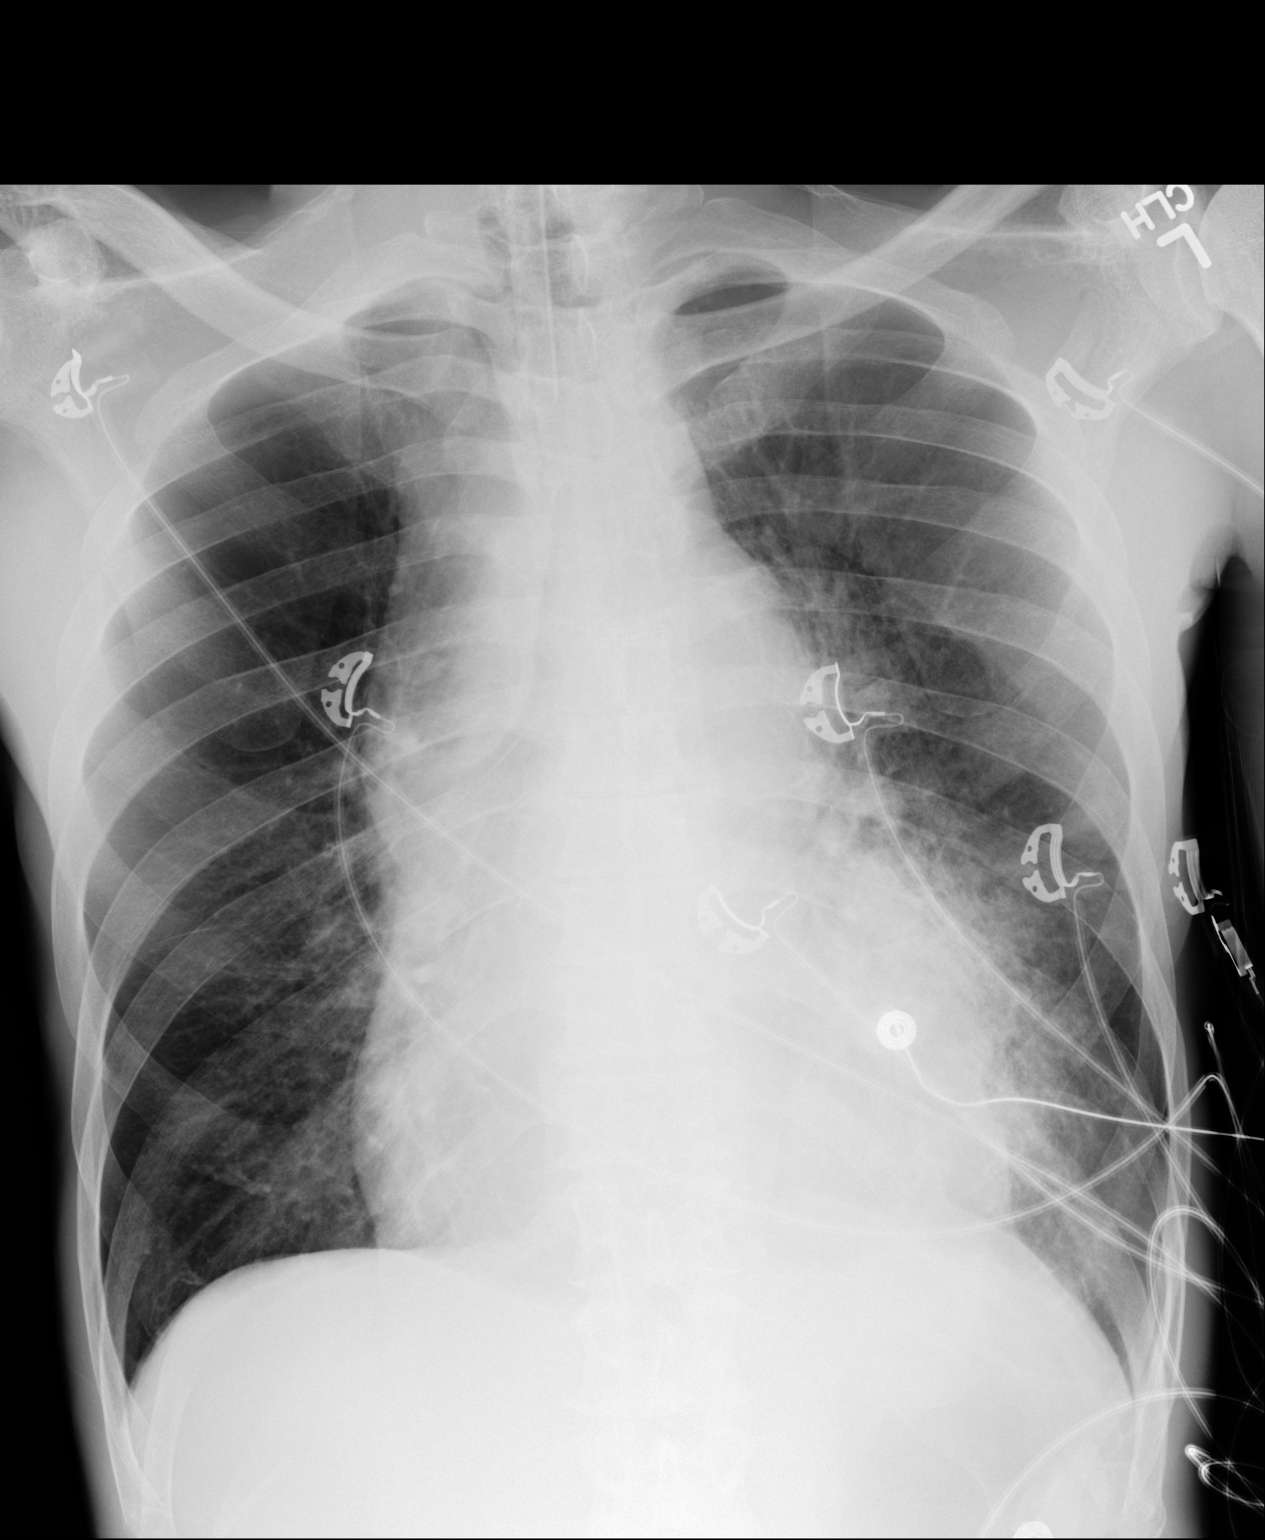

[1 of 1 positions shown; findings below may reference images not displayed]

FINDINGS: The patient is intubated with the tip of the endotracheal tube
positioned 6.9 cm above the carina. Cardiomegaly is stable from
prior exam. Allowing for patient rotation, mediastinal silhouette
within normal limits.

Lungs are hyperexpanded. Left perihilar and infrahilar infiltrate
present, which may reflect pneumonia or possibly sequelae of
aspiration. No pneumothorax. No pulmonary edema or pleural effusion.

No acute osseus abnormality.
IMPRESSION: 1. Tip of the endotracheal tube approximately 6.9 cm above the
carina.
2. Hyperinflation with left perihilar/infrahilar infiltrate, which
may reflect pneumonia or possibly sequelae of aspiration.
3. Stable cardiomegaly.
